# Patient Record
Sex: Female | Born: 1935 | Race: White | Hispanic: No | Marital: Married | Smoking: Never smoker
Health system: Southern US, Community
[De-identification: ages and names within clinical notes are randomized; demographics above are authoritative.]

## PROBLEM LIST (undated history)

## (undated) DIAGNOSIS — F0390 Unspecified dementia without behavioral disturbance: Secondary | ICD-10-CM

## (undated) DIAGNOSIS — G479 Sleep disorder, unspecified: Secondary | ICD-10-CM

## (undated) DIAGNOSIS — F0280 Dementia in other diseases classified elsewhere without behavioral disturbance: Secondary | ICD-10-CM

## (undated) DIAGNOSIS — I10 Essential (primary) hypertension: Secondary | ICD-10-CM

## (undated) DIAGNOSIS — K219 Gastro-esophageal reflux disease without esophagitis: Secondary | ICD-10-CM

## (undated) DIAGNOSIS — G309 Alzheimer's disease, unspecified: Secondary | ICD-10-CM

## (undated) HISTORY — DX: Essential (primary) hypertension: I10

---

## 1997-10-23 ENCOUNTER — Ambulatory Visit (HOSPITAL_COMMUNITY): Admission: RE | Admit: 1997-10-23 | Discharge: 1997-10-23 | Payer: Self-pay

## 1998-04-24 ENCOUNTER — Ambulatory Visit (HOSPITAL_COMMUNITY): Admission: RE | Admit: 1998-04-24 | Discharge: 1998-04-24 | Payer: Self-pay | Admitting: Obstetrics and Gynecology

## 1998-04-30 ENCOUNTER — Other Ambulatory Visit: Admission: RE | Admit: 1998-04-30 | Discharge: 1998-04-30 | Payer: Self-pay | Admitting: Obstetrics and Gynecology

## 1999-05-08 ENCOUNTER — Other Ambulatory Visit: Admission: RE | Admit: 1999-05-08 | Discharge: 1999-05-08 | Payer: Self-pay | Admitting: Obstetrics and Gynecology

## 1999-05-16 ENCOUNTER — Encounter: Payer: Self-pay | Admitting: Obstetrics and Gynecology

## 1999-05-16 ENCOUNTER — Ambulatory Visit (HOSPITAL_COMMUNITY): Admission: RE | Admit: 1999-05-16 | Discharge: 1999-05-16 | Payer: Self-pay | Admitting: Obstetrics and Gynecology

## 1999-08-29 ENCOUNTER — Emergency Department (HOSPITAL_COMMUNITY): Admission: EM | Admit: 1999-08-29 | Discharge: 1999-08-29 | Payer: Self-pay | Admitting: Emergency Medicine

## 2000-05-25 ENCOUNTER — Ambulatory Visit (HOSPITAL_COMMUNITY): Admission: RE | Admit: 2000-05-25 | Discharge: 2000-05-25 | Payer: Self-pay | Admitting: Obstetrics and Gynecology

## 2000-05-25 ENCOUNTER — Encounter: Payer: Self-pay | Admitting: Obstetrics and Gynecology

## 2000-06-08 ENCOUNTER — Other Ambulatory Visit: Admission: RE | Admit: 2000-06-08 | Discharge: 2000-06-08 | Payer: Self-pay | Admitting: Obstetrics and Gynecology

## 2000-11-03 ENCOUNTER — Encounter: Payer: Self-pay | Admitting: Neurology

## 2000-11-03 ENCOUNTER — Encounter: Admission: RE | Admit: 2000-11-03 | Discharge: 2000-11-03 | Payer: Self-pay | Admitting: Neurology

## 2001-02-26 ENCOUNTER — Encounter: Payer: Self-pay | Admitting: Neurology

## 2001-02-26 ENCOUNTER — Encounter: Admission: RE | Admit: 2001-02-26 | Discharge: 2001-02-26 | Payer: Self-pay | Admitting: Neurology

## 2001-06-30 ENCOUNTER — Ambulatory Visit (HOSPITAL_COMMUNITY): Admission: RE | Admit: 2001-06-30 | Discharge: 2001-06-30 | Payer: Self-pay | Admitting: Obstetrics and Gynecology

## 2001-06-30 ENCOUNTER — Encounter: Payer: Self-pay | Admitting: Obstetrics and Gynecology

## 2001-07-05 ENCOUNTER — Other Ambulatory Visit: Admission: RE | Admit: 2001-07-05 | Discharge: 2001-07-05 | Payer: Self-pay | Admitting: Obstetrics and Gynecology

## 2002-07-01 ENCOUNTER — Ambulatory Visit (HOSPITAL_COMMUNITY): Admission: RE | Admit: 2002-07-01 | Discharge: 2002-07-01 | Payer: Self-pay | Admitting: Obstetrics and Gynecology

## 2002-07-01 ENCOUNTER — Encounter: Payer: Self-pay | Admitting: Obstetrics and Gynecology

## 2002-07-12 ENCOUNTER — Encounter: Payer: Self-pay | Admitting: Obstetrics and Gynecology

## 2002-07-12 ENCOUNTER — Encounter: Admission: RE | Admit: 2002-07-12 | Discharge: 2002-07-12 | Payer: Self-pay | Admitting: Obstetrics and Gynecology

## 2002-08-08 ENCOUNTER — Other Ambulatory Visit: Admission: RE | Admit: 2002-08-08 | Discharge: 2002-08-08 | Payer: Self-pay | Admitting: Obstetrics and Gynecology

## 2003-02-02 ENCOUNTER — Encounter: Payer: Self-pay | Admitting: Orthopedic Surgery

## 2003-02-02 ENCOUNTER — Encounter: Admission: RE | Admit: 2003-02-02 | Discharge: 2003-02-02 | Payer: Self-pay | Admitting: Orthopedic Surgery

## 2003-02-09 ENCOUNTER — Encounter: Payer: Self-pay | Admitting: Internal Medicine

## 2003-08-14 ENCOUNTER — Encounter: Admission: RE | Admit: 2003-08-14 | Discharge: 2003-08-14 | Payer: Self-pay | Admitting: Family Medicine

## 2004-04-23 ENCOUNTER — Other Ambulatory Visit: Admission: RE | Admit: 2004-04-23 | Discharge: 2004-04-23 | Payer: Self-pay | Admitting: Obstetrics and Gynecology

## 2004-05-28 ENCOUNTER — Encounter: Admission: RE | Admit: 2004-05-28 | Discharge: 2004-05-28 | Payer: Self-pay | Admitting: Obstetrics and Gynecology

## 2004-08-27 ENCOUNTER — Encounter: Admission: RE | Admit: 2004-08-27 | Discharge: 2004-08-27 | Payer: Self-pay | Admitting: Obstetrics and Gynecology

## 2004-09-04 ENCOUNTER — Encounter: Admission: RE | Admit: 2004-09-04 | Discharge: 2004-09-04 | Payer: Self-pay | Admitting: Obstetrics and Gynecology

## 2004-11-25 ENCOUNTER — Ambulatory Visit: Payer: Self-pay | Admitting: Internal Medicine

## 2005-05-13 ENCOUNTER — Other Ambulatory Visit: Admission: RE | Admit: 2005-05-13 | Discharge: 2005-05-13 | Payer: Self-pay | Admitting: Obstetrics and Gynecology

## 2005-05-16 ENCOUNTER — Emergency Department (HOSPITAL_COMMUNITY): Admission: EM | Admit: 2005-05-16 | Discharge: 2005-05-17 | Payer: Self-pay | Admitting: Emergency Medicine

## 2005-09-05 ENCOUNTER — Encounter: Admission: RE | Admit: 2005-09-05 | Discharge: 2005-09-05 | Payer: Self-pay | Admitting: Obstetrics and Gynecology

## 2005-11-25 ENCOUNTER — Ambulatory Visit: Payer: Self-pay | Admitting: Internal Medicine

## 2006-06-18 ENCOUNTER — Encounter: Admission: RE | Admit: 2006-06-18 | Discharge: 2006-06-18 | Payer: Self-pay | Admitting: Obstetrics and Gynecology

## 2006-06-30 ENCOUNTER — Ambulatory Visit: Payer: Self-pay | Admitting: Internal Medicine

## 2006-07-01 ENCOUNTER — Ambulatory Visit: Payer: Self-pay | Admitting: Internal Medicine

## 2006-07-01 ENCOUNTER — Encounter: Payer: Self-pay | Admitting: Internal Medicine

## 2007-03-11 ENCOUNTER — Ambulatory Visit: Payer: Self-pay | Admitting: Internal Medicine

## 2007-04-28 ENCOUNTER — Encounter: Admission: RE | Admit: 2007-04-28 | Discharge: 2007-04-28 | Payer: Self-pay | Admitting: Orthopedic Surgery

## 2007-04-29 ENCOUNTER — Ambulatory Visit (HOSPITAL_BASED_OUTPATIENT_CLINIC_OR_DEPARTMENT_OTHER): Admission: RE | Admit: 2007-04-29 | Discharge: 2007-04-30 | Payer: Self-pay | Admitting: Orthopedic Surgery

## 2007-07-02 ENCOUNTER — Encounter: Admission: RE | Admit: 2007-07-02 | Discharge: 2007-07-02 | Payer: Self-pay | Admitting: Obstetrics and Gynecology

## 2008-05-03 ENCOUNTER — Encounter: Admission: RE | Admit: 2008-05-03 | Discharge: 2008-05-03 | Payer: Self-pay | Admitting: Obstetrics and Gynecology

## 2008-06-29 ENCOUNTER — Encounter: Admission: RE | Admit: 2008-06-29 | Discharge: 2008-06-29 | Payer: Self-pay | Admitting: Neurosurgery

## 2008-07-03 ENCOUNTER — Encounter: Admission: RE | Admit: 2008-07-03 | Discharge: 2008-07-03 | Payer: Self-pay | Admitting: Obstetrics and Gynecology

## 2008-09-08 ENCOUNTER — Inpatient Hospital Stay (HOSPITAL_COMMUNITY): Admission: RE | Admit: 2008-09-08 | Discharge: 2008-09-10 | Payer: Self-pay | Admitting: Neurosurgery

## 2008-11-12 ENCOUNTER — Emergency Department (HOSPITAL_BASED_OUTPATIENT_CLINIC_OR_DEPARTMENT_OTHER): Admission: EM | Admit: 2008-11-12 | Discharge: 2008-11-12 | Payer: Self-pay | Admitting: Emergency Medicine

## 2009-02-23 HISTORY — PX: NM MYOCAR PERF WALL MOTION: HXRAD629

## 2009-05-18 ENCOUNTER — Telehealth: Payer: Self-pay | Admitting: Internal Medicine

## 2009-05-18 DIAGNOSIS — K5730 Diverticulosis of large intestine without perforation or abscess without bleeding: Secondary | ICD-10-CM | POA: Insufficient documentation

## 2009-05-18 DIAGNOSIS — Z872 Personal history of diseases of the skin and subcutaneous tissue: Secondary | ICD-10-CM | POA: Insufficient documentation

## 2009-05-18 DIAGNOSIS — J984 Other disorders of lung: Secondary | ICD-10-CM

## 2009-05-18 DIAGNOSIS — K2970 Gastritis, unspecified, without bleeding: Secondary | ICD-10-CM | POA: Insufficient documentation

## 2009-05-18 DIAGNOSIS — R109 Unspecified abdominal pain: Secondary | ICD-10-CM | POA: Insufficient documentation

## 2009-05-18 DIAGNOSIS — Z860100 Personal history of colon polyps, unspecified: Secondary | ICD-10-CM | POA: Insufficient documentation

## 2009-05-18 DIAGNOSIS — Z8719 Personal history of other diseases of the digestive system: Secondary | ICD-10-CM

## 2009-05-18 DIAGNOSIS — E785 Hyperlipidemia, unspecified: Secondary | ICD-10-CM

## 2009-05-18 DIAGNOSIS — M129 Arthropathy, unspecified: Secondary | ICD-10-CM | POA: Insufficient documentation

## 2009-05-18 DIAGNOSIS — K2990 Gastroduodenitis, unspecified, without bleeding: Secondary | ICD-10-CM

## 2009-05-18 DIAGNOSIS — F329 Major depressive disorder, single episode, unspecified: Secondary | ICD-10-CM | POA: Insufficient documentation

## 2009-05-18 DIAGNOSIS — F3289 Other specified depressive episodes: Secondary | ICD-10-CM | POA: Insufficient documentation

## 2009-05-18 DIAGNOSIS — K5520 Angiodysplasia of colon without hemorrhage: Secondary | ICD-10-CM | POA: Insufficient documentation

## 2009-05-21 ENCOUNTER — Ambulatory Visit: Payer: Self-pay | Admitting: Internal Medicine

## 2009-05-21 LAB — CONVERTED CEMR LAB
ALT: 20 units/L (ref 0–35)
AST: 17 units/L (ref 0–37)
Albumin: 3.9 g/dL (ref 3.5–5.2)
Alkaline Phosphatase: 58 units/L (ref 39–117)
BUN: 17 mg/dL (ref 6–23)
Basophils Absolute: 0 10*3/uL (ref 0.0–0.1)
Basophils Relative: 0.5 % (ref 0.0–3.0)
CO2: 26 meq/L (ref 19–32)
Calcium: 9.3 mg/dL (ref 8.4–10.5)
Chloride: 104 meq/L (ref 96–112)
Creatinine, Ser: 0.7 mg/dL (ref 0.4–1.2)
Eosinophils Absolute: 0.1 10*3/uL (ref 0.0–0.7)
Eosinophils Relative: 1.9 % (ref 0.0–5.0)
GFR calc non Af Amer: 85.77 mL/min (ref >60)
Glucose, Bld: 97 mg/dL (ref 70–99)
HCT: 34.5 % — ABNORMAL LOW (ref 36.0–46.0)
Hemoglobin: 11.5 g/dL — ABNORMAL LOW (ref 12.0–15.0)
Lymphocytes Relative: 25.8 % (ref 12.0–46.0)
Lymphs Abs: 1.7 10*3/uL (ref 0.7–4.0)
MCHC: 33.5 g/dL (ref 30.0–36.0)
MCV: 88.3 fL (ref 78.0–100.0)
Monocytes Absolute: 0.6 10*3/uL (ref 0.1–1.0)
Monocytes Relative: 9 % (ref 3.0–12.0)
Neutro Abs: 4.2 10*3/uL (ref 1.4–7.7)
Neutrophils Relative %: 62.8 % (ref 43.0–77.0)
Platelets: 217 10*3/uL (ref 150.0–400.0)
Potassium: 4.2 meq/L (ref 3.5–5.1)
RBC: 3.9 M/uL (ref 3.87–5.11)
RDW: 12.7 % (ref 11.5–14.6)
Sodium: 143 meq/L (ref 135–145)
Total Bilirubin: 0.5 mg/dL (ref 0.3–1.2)
Total Protein: 6.4 g/dL (ref 6.0–8.3)
WBC: 6.6 10*3/uL (ref 4.5–10.5)

## 2009-06-12 ENCOUNTER — Encounter: Payer: Self-pay | Admitting: Emergency Medicine

## 2009-06-12 ENCOUNTER — Inpatient Hospital Stay (HOSPITAL_COMMUNITY): Admission: RE | Admit: 2009-06-12 | Discharge: 2009-06-14 | Payer: Self-pay | Admitting: Internal Medicine

## 2009-06-12 ENCOUNTER — Ambulatory Visit: Payer: Self-pay | Admitting: Diagnostic Radiology

## 2009-06-13 ENCOUNTER — Encounter (INDEPENDENT_AMBULATORY_CARE_PROVIDER_SITE_OTHER): Payer: Self-pay | Admitting: Internal Medicine

## 2009-06-13 ENCOUNTER — Ambulatory Visit: Payer: Self-pay | Admitting: Vascular Surgery

## 2009-06-14 ENCOUNTER — Encounter (INDEPENDENT_AMBULATORY_CARE_PROVIDER_SITE_OTHER): Payer: Self-pay | Admitting: Internal Medicine

## 2009-07-06 ENCOUNTER — Encounter: Admission: RE | Admit: 2009-07-06 | Discharge: 2009-07-06 | Payer: Self-pay | Admitting: Obstetrics and Gynecology

## 2009-12-05 ENCOUNTER — Ambulatory Visit: Payer: Self-pay | Admitting: Internal Medicine

## 2009-12-05 LAB — CONVERTED CEMR LAB
ALT: 19 units/L (ref 0–35)
AST: 16 units/L (ref 0–37)
Albumin: 3.8 g/dL (ref 3.5–5.2)
Alkaline Phosphatase: 67 units/L (ref 39–117)
BUN: 13 mg/dL (ref 6–23)
Basophils Absolute: 0 10*3/uL (ref 0.0–0.1)
Basophils Relative: 0.5 % (ref 0.0–3.0)
CO2: 30 meq/L (ref 19–32)
Calcium: 9.1 mg/dL (ref 8.4–10.5)
Chloride: 104 meq/L (ref 96–112)
Creatinine, Ser: 0.7 mg/dL (ref 0.4–1.2)
Eosinophils Absolute: 0.1 10*3/uL (ref 0.0–0.7)
Eosinophils Relative: 1.8 % (ref 0.0–5.0)
GFR calc non Af Amer: 85.65 mL/min (ref >60)
Glucose, Bld: 107 mg/dL — ABNORMAL HIGH (ref 70–99)
HCT: 35 % — ABNORMAL LOW (ref 36.0–46.0)
Hemoglobin: 12.1 g/dL (ref 12.0–15.0)
Lymphocytes Relative: 23.3 % (ref 12.0–46.0)
Lymphs Abs: 1.4 10*3/uL (ref 0.7–4.0)
MCHC: 34.6 g/dL (ref 30.0–36.0)
MCV: 88.4 fL (ref 78.0–100.0)
Monocytes Absolute: 0.5 10*3/uL (ref 0.1–1.0)
Monocytes Relative: 8.1 % (ref 3.0–12.0)
Neutro Abs: 3.9 10*3/uL (ref 1.4–7.7)
Neutrophils Relative %: 66.3 % (ref 43.0–77.0)
Platelets: 239 10*3/uL (ref 150.0–400.0)
Potassium: 4.4 meq/L (ref 3.5–5.1)
RBC: 3.96 M/uL (ref 3.87–5.11)
RDW: 13.8 % (ref 11.5–14.6)
Sodium: 141 meq/L (ref 135–145)
Total Bilirubin: 0.7 mg/dL (ref 0.3–1.2)
Total Protein: 6.5 g/dL (ref 6.0–8.3)
WBC: 5.9 10*3/uL (ref 4.5–10.5)

## 2009-12-06 ENCOUNTER — Ambulatory Visit: Payer: Self-pay | Admitting: Internal Medicine

## 2009-12-17 ENCOUNTER — Ambulatory Visit (HOSPITAL_COMMUNITY): Admission: RE | Admit: 2009-12-17 | Discharge: 2009-12-17 | Payer: Self-pay | Admitting: Internal Medicine

## 2010-07-16 ENCOUNTER — Encounter: Admission: RE | Admit: 2010-07-16 | Discharge: 2010-07-16 | Payer: Self-pay | Admitting: Obstetrics and Gynecology

## 2010-07-24 ENCOUNTER — Encounter
Admission: RE | Admit: 2010-07-24 | Discharge: 2010-07-24 | Payer: Self-pay | Source: Home / Self Care | Attending: Obstetrics and Gynecology | Admitting: Obstetrics and Gynecology

## 2010-09-12 NOTE — Procedures (Signed)
Summary: Colonoscopy  Patient: Fraidy Mccarrick Note: All result statuses are Final unless otherwise noted.  Tests: (1) Colonoscopy (COL)   COL Colonoscopy           DONE     [facility]     [address]     [place], [place]  [address]           COLONOSCOPY PROCEDURE REPORT           PATIENT:  Leanah, Kolander  MR#:  [id number]     BIRTHDATE:  [birth year], 81 yrs. old  GENDER:  female     ENDOSCOPIST:  Hedwig Morton. Juanda Chance, MD     REF. BY:  Miguel Aschoff, M.D.     PROCEDURE DATE:  12/06/2009     PROCEDURE:  Colonoscopy 19147     ASA CLASS:  Class I     INDICATIONS:  Elevated Risk Screening prior colon 1999,anal     fistula 1994     colon polyp 2004     ? avm right colon 2004     MEDICATIONS:   Versed 10 mg, Fentanyl 75 mcg           DESCRIPTION OF PROCEDURE:   After the risks benefits and     alternatives of the procedure were thoroughly explained, informed     consent was obtained.  Digital rectal exam was performed and     revealed no rectal masses.   The LB CF-[id number] endoscope     was introduced through the anus and advanced to the cecum, which     was identified by both the appendix and ileocecal valve, without     limitations.  The quality of the prep was good, using MiraLax.     The instrument was then slowly withdrawn as the colon was fully     examined.     <<PROCEDUREIMAGES>>           FINDINGS:  No polyps or cancers were seen (see image1, image2,     image3, and image4).   Retroflexed views in the rectum revealed no     abnormalities.    The scope was then withdrawn from the patient     and the procedure completed.           COMPLICATIONS:  None     ENDOSCOPIC IMPRESSION:     1) No polyps or cancers     RECOMMENDATIONS:     1) high fiber diet     REPEAT EXAM:  In 10 year(s) for.           ______________________________     Hedwig Morton. Juanda Chance, MD           CC:           n.     eSIGNED:   Hedwig Morton. Teruo Stilley at 12/06/2009 10:28 AM           Aaron Mose,  [phone]  Note: An exclamation mark (!) indicates a result that was not dispersed into the flowsheet. Document Creation Date: 12/06/2009 10:29 AM _______________________________________________________________________  (1) Order result status: Final Collection or observation date-time: 12/06/2009 10:17 Requested date-time:  Receipt date-time:  Reported date-time:  Referring Physician:   Ordering Physician: Lina Sar [phone]) Specimen Source:  Source: Launa Grill Order Number: [id number] Lab site:   Appended Document: Colonoscopy    Clinical Lists Changes  Observations: Added new observation of COLONNXTDUE: 11/2019 (12/06/2009 14:51)

## 2010-09-12 NOTE — Letter (Signed)
Summary: [facility] Instructions  [facility]  [address] [place], [place] [address]   Phone: [phone]  Fax: [phone]       [name] [name]    1928-07-05    MRN: [id number]       Procedure Day /Date: 12/06/09 Thursday     Arrival Time: 8:30 am     Procedure Time: 9:30 am     Location of Procedure:                    _x_  [facility] (4th Floor)  PREPARATION FOR COLONOSCOPY WITH MIRALAX  Starting 5 days prior to your procedure  do not eat nuts, seeds, popcorn, corn, beans, peas,  salads, or any raw vegetables.  Do not take any fiber supplements (e.g. Metamucil, Citrucel, and Benefiber). ____________________________________________________________________________________________________   THE DAY BEFORE YOUR PROCEDURE         DATE: 12/05/09 DAY: Wednesday  1   Drink clear liquids the entire day-NO SOLID FOOD  2   Do not drink anything colored red or purple.  Avoid juices with pulp.  No orange juice.  3   Drink at least 64 oz. (8 glasses) of fluid/clear liquids during the day to prevent dehydration and help the prep work efficiently.  CLEAR LIQUIDS INCLUDE: Water Jello Ice Popsicles Tea (sugar ok, no milk/cream) Powdered fruit flavored drinks Coffee (sugar ok, no milk/cream) Gatorade Juice: apple, white grape, white cranberry  Lemonade Clear bullion, consomm, broth Carbonated beverages (any kind) Strained chicken noodle soup Hard Candy  4   Mix the entire bottle of Miralax with 64 oz. of Gatorade/Powerade in the morning and put in the refrigerator to chill.  5   At 3:00 pm take 2 Dulcolax/Bisacodyl tablets.  6   At 4:30 pm take one Reglan/Metoclopramide tablet.  7  Starting at 5:00 pm drink one 8 oz glass of the Miralax mixture every 15-20 minutes until you have finished drinking the entire 64 oz.  You should finish drinking prep around 7:30 or 8:00 pm.  8   If you are nauseated, you may take the 2nd Reglan/Metoclopramide tablet at 6:30  pm.        9    At 8:00 pm take 2 more DULCOLAX/Bisacodyl tablets.        THE DAY OF YOUR PROCEDURE      DATE:  12/06/09 DAY: Thursday  You may drink clear liquids until 7:30 am  (2 HOURS BEFORE PROCEDURE).   MEDICATION INSTRUCTIONS  Unless otherwise instructed, you should take regular prescription medications with a small sip of water as early as possible the morning of your procedure.       OTHER INSTRUCTIONS  You will need a responsible adult at least 75 years of age to accompany you and drive you home.   This person must remain in the waiting room during your procedure.  Wear loose fitting clothing that is easily removed.  Leave jewelry and other valuables at home.  However, you may wish to bring a book to read or an iPod/MP3 player to listen to music as you wait for your procedure to start.  Remove all body piercing jewelry and leave at home.  Total time from sign-in until discharge is approximately 2-3 hours.  You should go home directly after your procedure and rest.  You can resume normal activities the day after your procedure.  The day of your procedure you should not:   Drive   Make legal decisions  Operate machinery   Drink alcohol   Return to work  You will receive specific instructions about eating, activities and medications before you leave.   The above instructions have been reviewed and explained to me by   Lamona Curl CMA Duncan Dull)  December 05, 2009 9:07 AM     I fully understand and can verbalize these instructions _____________________________ Date 12/05/09

## 2010-09-12 NOTE — Assessment & Plan Note (Signed)
Summary: ABD PAIN...AS.   History of Present Illness Visit Type: Follow-up Visit Primary GI MD: Lina Sar MD Primary Provider: Duane Lope, MD  Requesting Provider: n/a Chief Complaint: Generilized abdominal pain also c/o N/V with pain History of Present Illness:   This is a 77 year old white female whom we have seen in the past for gastritis as well as for small bowel obstruction.  A colonoscopy done in July 2004 showed a polyp. The pathology confirmed the polyp to be consistant with polypoid mucosa. She also had AVMs. Her abdominal ultrasound in 2008 showed her common bile duct to be 7.8 mm with a normal gallbladder wall of 1.6 mm. She had gastritis on an upper endoscopy in November 2007 which was H. pylori positive. She does not remember being treated. She comes today complaining of generalized abdominal pain with nausea and vomiting. She denies any reflux symptoms or change in bowels.   GI Review of Systems    Reports abdominal pain, nausea, and  vomiting.     Location of  Abdominal pain: generalized.    Denies acid reflux, belching, bloating, chest pain, dysphagia with liquids, dysphagia with solids, heartburn, loss of appetite, vomiting blood, weight loss, and  weight gain.        Denies anal fissure, black tarry stools, change in bowel habit, constipation, diarrhea, diverticulosis, fecal incontinence, heme positive stool, hemorrhoids, irritable bowel syndrome, jaundice, light color stool, liver problems, rectal bleeding, and  rectal pain.    Current Medications (verified): 1)  Bayer Low Strength 81 Mg Tbec (Aspirin) .... One Tablet By Mouth Once Daily 2)  Iron 325 (65 Fe) Mg Tabs (Ferrous Sulfate) .... One Tablet By Mouth Once Daily 3)  Calcium 600 1500 Mg Tabs (Calcium Carbonate) .... One Tablet By Mouth Once Daily 4)  Coq10 100 Mg Caps (Coenzyme Q10) .... One Tablet By Mouth Once Daily 5)  Fish Oil 1000 Mg Caps (Omega-3 Fatty Acids) .... One Tablet By Mouth Once Daily 6)  Diovan  Hct 160-12.5 Mg Tabs (Valsartan-Hydrochlorothiazide) .Marland Kitchen.. 1 By Mouth Once Daily  Allergies: 1)  ! Pcn  Past History:  Past Medical History: Reviewed history from 05/18/2009 and no changes required. 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 Anal Fissure Repair Back Surgery   Family History: Family History of Pancreatic Cancer: Father Family History of Heart Disease: Mother Family History of Diabetes: Mother, Father  Social History: Reviewed history from 05/21/2009 and no changes required. Alcohol Use - yes-occasional Illicit Drug Use - no Occupation: Retired Married Daily Caffeine Use: 2 cups of coffee daily and three glasses of tea daily   Review of Systems       The patient complains of sleeping problems.  The patient denies allergy/sinus, anemia, anxiety-new, arthritis/joint pain, back pain, blood in urine, breast changes/lumps, change in vision, confusion, cough, coughing up blood, depression-new, fainting, fatigue, fever, headaches-new, hearing problems, heart murmur, heart rhythm changes, itching, menstrual pain, muscle pains/cramps, night sweats, nosebleeds, pregnancy symptoms, shortness of breath, skin rash, sore throat, swelling of feet/legs, swollen lymph glands, thirst - excessive , urination - excessive , urination changes/pain, urine leakage, vision changes, and voice change.  Pertinent positive and negative review of systems were noted in the above HPI. All other ROS was otherwise negative.   Vital Signs:  Patient  profile:   77 year old female Height:      63 inches Weight:      146 pounds BMI:     25.96 BSA:     1.69 Pulse rate:   90 / minute Pulse rhythm:   regular BP sitting:   122 / 82  (left arm)  Vitals Entered By: Merri Ray CMA Duncan Dull) (December 05, 2009 8:17 AM)  Physical Exam  General:  Well developed, well nourished, no acute distress. Eyes:  PERRLA, no icterus. Mouth:  No deformity or lesions, dentition normal. Neck:  Supple; no masses or thyromegaly. Lungs:  Clear throughout to auscultation. Heart:  Regular rate and rhythm; no murmurs, rubs,  or bruits. Abdomen:  soft mildly protuberant abdomen with normal active bowel sounds. Mild tenderness in right lower quadrant across post appendectomy scar. There is no palpable mass or rebound. Liver edge is at  costal margin. Extremities:  No clubbing, cyanosis, edema or deformities noted. Skin:  Intact without significant lesions or rashes. Psych:  Alert and cooperative. Normal mood and affect.   Impression & Recommendations:  Problem # 1:  ABDOMINAL PAIN, UNSPECIFIED SITE (ICD-789.00) Patient has lower abdominal pain with tenderness in the right lower quadrant. Her last colonoscopy was in 2004. She is due for a recall colonoscopy because of her history of colon polyps. She had an abnormal abdominal ultrasound 3 years ago with a dilated common bile duct at 7.8 mm. We will obtain a HIDA scan to rule out biliary dysfunction and we will also obtain liver function tests.  Orders: Colonoscopy (Colon) HIDA CCK (HIDA CCK) TLB-CBC Platelet - w/Differential (85025-CBCD) TLB-CMP (Comprehensive Metabolic Pnl) (80053-COMP)  Problem # 2:  COLONIC POLYPS, HX OF (ICD-V12.72) Patient is due for her recall colonoscopy. We will schedule that today.  Orders: Colonoscopy (Colon)  Problem # 3:  HELICOBACTER PYLORI GASTRITIS, HX OF (ICD-V12.79) Patient is status post treatment for H. pylori.  Patient Instructions: 1)  HIDA scan with CCK-scheduled  for 12/17/09. 2)  Please go to the basement to have her liver function tests and CBC drawn today. 3)  A colonoscopy has been scheduled for 12/06/09. 4)  Samples of AcipHex 20 mg daily. 5)  Copy sent to : Dr A.Ross 6)  The medication list was reviewed and reconciled.  All changed / newly prescribed medications were explained.  A complete medication list was provided to the patient / caregiver. Prescriptions: DULCOLAX 5 MG  TBEC (BISACODYL) Day before procedure take 2 at 3pm and 2 at 8pm.  #4 x 0   Entered by:   Lamona Curl CMA (AAMA)   Authorized by:   Hart Carwin MD   Signed by:   Lamona Curl CMA (AAMA) on 12/05/2009   Method used:   Electronically to        [facility] (retail)       [address]       [place], [place]  [address]       Ph: [phone] or [phone]       Fax: [phone]   RxID:   [id number] REGLAN 10 MG  TABS (METOCLOPRAMIDE HCL) As per prep instructions.  #2 x 0   Entered by:   Lamona Curl CMA (AAMA)   Authorized by:   Verlee Monte  Arne Cleveland MD   Signed by:   Lamona Curl CMA (AAMA) on 12/05/2009   Method used:   Electronically to        [facility] (retail)       [address]       [place], [place]  [address]       Ph: [phone] or [phone]       Fax: [phone]   RxID:   [id number] MIRALAX   POWD (POLYETHYLENE GLYCOL 3350) As per prep  instructions.  #255gm x 0   Entered by:   Lamona Curl CMA (AAMA)   Authorized by:   Hart Carwin MD   Signed by:   Lamona Curl CMA (AAMA) on 12/05/2009   Method used:   Electronically to        [facility] (retail)       [address]       [place], [place]  [address]       Ph: [phone] or [phone]       Fax: [phone]   RxID:   [id number]

## 2010-11-13 LAB — BASIC METABOLIC PANEL
BUN: 10 mg/dL (ref 6–23)
Chloride: 106 mEq/L (ref 96–112)
Creatinine, Ser: 0.67 mg/dL (ref 0.4–1.2)
GFR calc non Af Amer: >60 mL/min (ref >60)
Glucose, Bld: 104 mg/dL — ABNORMAL HIGH (ref 70–99)
Potassium: 3.2 mEq/L — ABNORMAL LOW (ref 3.5–5.1)

## 2010-11-13 LAB — CBC
HCT: 37 % (ref 36.0–46.0)
Hemoglobin: 11 g/dL — ABNORMAL LOW (ref 12.0–15.0)
Hemoglobin: 12.9 g/dL (ref 12.0–15.0)
MCHC: 35 g/dL (ref 30.0–36.0)
MCV: 87.2 fL (ref 78.0–100.0)
RBC: 3.61 MIL/uL — ABNORMAL LOW (ref 3.87–5.11)
RBC: 4.24 MIL/uL (ref 3.87–5.11)

## 2010-11-13 LAB — DIFFERENTIAL
Eosinophils Absolute: 0 10*3/uL (ref 0.0–0.7)
Eosinophils Relative: 0 % (ref 0–5)
Lymphocytes Relative: 14 % (ref 12–46)
Lymphs Abs: 1.1 10*3/uL (ref 0.7–4.0)
Monocytes Relative: 5 % (ref 3–12)
Neutrophils Relative %: 81 % — ABNORMAL HIGH (ref 43–77)

## 2010-11-13 LAB — POCT CARDIAC MARKERS
Myoglobin, poc: 41.7 ng/mL (ref 12–200)
Troponin i, poc: <0.05 ng/mL (ref 0.00–0.09)

## 2010-11-13 LAB — COMPREHENSIVE METABOLIC PANEL
ALT: 25 U/L (ref 0–35)
CO2: 26 mEq/L (ref 19–32)
Calcium: 9.3 mg/dL (ref 8.4–10.5)
Creatinine, Ser: 0.5 mg/dL (ref 0.4–1.2)
GFR calc non Af Amer: >60 mL/min (ref >60)
Glucose, Bld: 155 mg/dL — ABNORMAL HIGH (ref 70–99)

## 2010-11-13 LAB — LIPID PANEL
Cholesterol: 195 mg/dL (ref 0–200)
LDL Cholesterol: 136 mg/dL — ABNORMAL HIGH (ref 0–99)
Triglycerides: 154 mg/dL — ABNORMAL HIGH (ref <150)
VLDL: 31 mg/dL (ref 0–40)

## 2010-11-25 LAB — TYPE AND SCREEN
ABO/RH(D): A POS
Antibody Screen: NEGATIVE

## 2010-11-25 LAB — BASIC METABOLIC PANEL
CO2: 27 mEq/L (ref 19–32)
Calcium: 9.6 mg/dL (ref 8.4–10.5)
Creatinine, Ser: 0.57 mg/dL (ref 0.4–1.2)
GFR calc Af Amer: >60 mL/min (ref >60)
Sodium: 140 mEq/L (ref 135–145)

## 2010-11-25 LAB — CBC
Hemoglobin: 12.8 g/dL (ref 12.0–15.0)
RBC: 4.21 MIL/uL (ref 3.87–5.11)
WBC: 7.9 10*3/uL (ref 4.0–10.5)

## 2010-11-25 LAB — ABO/RH: ABO/RH(D): A POS

## 2010-12-24 NOTE — Op Note (Signed)
NAMEBLAKELEIGH, DOMEK                ACCOUNT NO.:  [id number]   MEDICAL RECORD NO.:  [id number]          PATIENT TYPE:  AMB   LOCATION:  DSC                          FACILITY:  [facility]   PHYSICIAN:  Loreta Ave, M.D. DATE OF BIRTH:  Dec 29, 1927   DATE OF PROCEDURE:  04/29/2007  DATE OF DISCHARGE:                               OPERATIVE REPORT   PREOPERATIVE DIAGNOSIS:  Right shoulder impingement, distal clavicle  osteolysis, rotator cuff tear.   POSTOPERATIVE DIAGNOSES:  1. Right shoulder impingement, distal clavicle osteolysis, rotator      cuff tear.  2. 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   PROCEDURE:  1. Right shoulder exam under anesthesia.  2. Arthroscopy with debridement of rotator cuff.  3. Acromioplasty, coraco-acromial ligament release, bursectomy.  4. Excision of distal clavicle.  5. Attempted arthroscopic followed by mini open repair of rotator cuff      tear with FiberWire weave suture x2 and Concept repair system.   SURGEON:  Loreta Ave, M.D.   ASSISTANT:  Zonia Kief, P.A.   ANESTHESIA:  General.   BLOOD LOSS:  Minimal.   SPECIMENS:  None.   CULTURES:  None   COMPLICATIONS:  None.   PROCEDURE:  Soft compressive with immobilizer.   PROCEDURE:  The patient was brought to the operating room and placed on  the operative table in supine position.  After adequate anesthesia had  been obtained, right shoulder was examined.  Full motion with stable  shoulder.  Placed in a beach-chair position on a shoulder positioner and  prepped and draped in the usual sterile fashion.  Three portals,  anterior, posterior and lateral.  Shoulder was entered with a blunt  obturator, arthroscope introduced and shoulder distended and inspected.  Full-thickness tear of entire supraspinatus tendon, attritional in  nature, crescent region.  Still mobile and reparable.  Very reasonable  cuff quality.  Biceps tendon, biceps anchor, articular  cartilage,  capsule and ligamentous structures intact.  Cuff debrided and assessed.  Cannula redirected subacromially.  Type II to III acromion.  Acromioplasty to a type 1 acromion, releasing the CA ligament with  cautery.  Distal clavicle with grade 4 changes and marked spurring.  Periarticular spurs and a lateral centimeter of clavicle resected.  Adequacy of decompression confirmed throughout.  I then used the  Scorpion device to insert 2 horizontal mattress Fiberweave sutures into  the cuff.  These were brought out laterally and I attempted to repair  the cuff with the swivel lock bioabsorbable screw.  Unfortunately, once  the screw was inserted into the tuberosity and I attempted this in 2  areas, it would not hold.  This was because of the degree of osteopenia  within the tuberosity itself.  The sutures left in place.  The anchors  that did hold were removed.  Deltoid-splitting incision.  Subacromial  space accessed.  Adequacy of decompression confirmed.  I then made a  series of drill holes in the tuberosity, took the Fiberweave sutures  through these.  I abducted the arm and firmly tied them over a bony  bridge,  coming way down laterally on the humerus.  I got reasonable  enough bone there to get a firm nice watertight closure of the cuff.  I  am still concerned about this holding up, but I was able to get a nice  firm repair.  Wound irrigated.  Deltoid closed with Vicryl, skin and  subcutaneous tissue with Vicryl.  Portals closed with nylon.  Sterile  compressive dressing applied.  Shoulder immobilizer applied.  Anesthesia  reversed.  Brought to recovery room.  Tolerated surgery well.  No  complications.      Loreta Ave, M.D.  Electronically Signed     DFM/MEDQ  D:  04/29/2007  T:  04/30/2007  Job:  [id number]

## 2010-12-24 NOTE — Op Note (Signed)
NAME:  [name], [name]                ACCOUNT NO.:  [id number]   MEDICAL RECORD NO.:  [id number]          PATIENT TYPE:  INP   LOCATION:  3006                         FACILITY:  [facility]   PHYSICIAN:  Reinaldo Meeker, M.D. DATE OF BIRTH:  Aug 28, 1927   DATE OF PROCEDURE:  09/08/2008  DATE OF DISCHARGE:                               OPERATIVE REPORT   PREOPERATIVE DIAGNOSIS:  Spondylolisthesis, L4-5 with left L4-5 synovial  cyst.   POSTOPERATIVE DIAGNOSIS:  Spondylolisthesis, L4-5 with left L4-5  synovial cyst.   PROCEDURES:  1. L4-5 bilateral decompression of L4 and L5 nerve roots bilaterally      more so than needed for posterior lumbar interbody fusion with      removal of synovial cyst at L4-5 left, followed by L4-5 posterior      lumbar interbody fusion with NuVasive bony spacer and PEEK      interbody cage, followed by nonsegmental instrumentation, L4-5 with      pedicle screw fixation with Biomet pedicle screw system, followed      by L4-5 nonsegmental fusion, posterolateral.  2. Microdissection, L4 and L5 nerve roots bilaterally.   SURGEON:  Reinaldo Meeker, MD   ASSISTANT:  Donalee Citrin, MD   PROCEDURE IN DETAIL:  After being placed in the prone position, the  patient's back was prepped and draped in the usual sterile fashion.  Localizing fluoroscopy was used prior to incision to identify the  appropriate level.  Midline incision was made above the spinous  processes of L4 and L5.  Using the Bovie cutting current, incision was  carried down to the spinous processes.  Subperiosteal dissection was  then carried out bilaterally on the spinous processes, lamina, facet  joint into the far lateral region to identify the transverse processes  of L4 and L5.  A fluoroscopy showed this to be appropriate level.  Spinous processes of L4 and L5 were removed bilaterally along with the  interspinous ligament.  On the patient's left side, generous laminotomy  was performed by removing the  inferior 80% of the L4 lamina, the medial  three-quarters of facet joint, and the superior one-third of the L5  lamina.  Residual bone was removed and saved for use later in the case.  Ligamentum flavum was removed in a piecemeal fashion.  Large synovial  cyst was noted and was dissected free from the spinal dura and L4 nerve  roots, which was attached.  Similar decompression was then carried on  the patient's right side.  At this time, residual midline structures  were removed to complete the bilateral decompression.  At this time,  disk space was then cleaned out thoroughly with a variety of pituitary  rongeurs and curettes.  Thorough disk space clean-out was carried out at  the same time great care was taken to avoid injury to the neural  elements, and this was successfully done.  At this time, pedicle screw  fixation was carried out.  Started the patient's left side, drill hole  entry points were made, followed by passing of an awl, tapping with a  5.5-mm tap  and placing of 6.5 x 40 mm screws at L4 and L5 bilaterally.  These were followed into good position under fluoroscopy in AP and  lateral direction.  Prior to placing the second screw bilaterally,  decortication was carried out at the transverse processes and lateral  facet joint bilaterally.  Mixture of autologous bone and Osteocel Plus  were placed for posterolateral fusion.  At this time, posterior lumbar  interbody fusion was carried out.  On the first side, distraction was  carried up to 10-mm size.  On the opposite side, rotating cutter was  used followed by placing of a PEEK interbody cage, filled with  autologous bone and Osteocel Plus.  On the opposite side, rotating  cutter was used followed by chiseling with a 10-mm chisel.  A mixture of  Osteocel Plus and autologous bone were placed in the midline to help  with interbody fusion that a NuVasive bony spacer of 10-mm size was  placed without difficulty.  Fluoroscopy showed  it to be in excellent  position.  Appropriate length rods were then chosen and secured to the  top of the screws.  Top loading nuts were then secured and torqued down.  When the L4 screws will be tightened, compression was carried out of L4  down to L5.  Final fluoroscopy in AP and lateral direction was found to  be excellent with good placement of the bony spacers, screws and rods.  A Gelfoam was placed after large amounts of irrigation were carried out.  An epidural drain was left in the epidural space and brought through a  separate stab incision.  The wounds were then closed in multiple layers  with Vicryl on the muscle fascia, subcutaneous, and subcuticular  tissues, and staples were placed on the skin.  A sterile dressing was  then applied, and the patient was extubated and taken to recovery room  in stable condition.           ______________________________  Reinaldo Meeker, M.D.     ROK/MEDQ  D:  09/08/2008  T:  09/09/2008  Job:  [id number]

## 2010-12-27 NOTE — Assessment & Plan Note (Signed)
[facility]                           GASTROENTEROLOGY OFFICE NOTE   MARGUERITE, BARBA                       MRN:          [id number]  DATE:06/30/2006                            DOB:          1928-06-10    Ms. Carlton is a very nice 77 year old white female whom we have followed in  the past for colon polyps and AVMs of the right colon.  Last colonoscopy in  2004.  Next __________ colonoscopy would be July 2009.  She is here today  because of epigastric discomfort.  She has a pain in subxyphoid area on a  daily basis which is aggravated by her bending over or coughing or sneezing.  She denies any radiation of the pain to the back.  There has been no weight  loss and no association with food.  There is no dysphagia or odynophagia.  She has never had an upper endoscopy.   MEDICATIONS:  1. Estrogen patch.  2. Paxil 10 mg daily.  3. Calcium supplements.  4. Fish oil.  5. Red rice yeast.  6. Enalapril 20 mg p.o. daily.  7. Toprol XL 50 mg p.o. daily.  8. Crestor 5 mg p.o. daily.   Significant past family history is that her father had pancreatic cancer and  what sounds like Whipple procedure and he died of metastatic cancer.  Patient is naturally concerned about that and would like to have this  evaluated.   PHYSICAL EXAMINATION:  Blood pressure 118/74. Pulse 88.  Weight 144 pounds.  She was in no distress.  LUNGS:  Are clear to auscultation.  COR:  With normal S1, normal S2. Sternum and ribcage were nontender.  ABDOMEN:  Was soft with tenderness about the umbilicus in the midline, which  did not radiate to left or right upper quadrant.  It did not radiate to the  chest either.  Low abdomen was unremarkable.  There was no pulsating mass or  bruit.  No CVA tenderness.  EXTREMITIES:  No edema.   IMPRESSION:  A 77 year old white female with persistent supraumbilical and  epigastric discomfort which is brought on by bending over and coughing.  It  is suggestive of hiatal hernia. She has a family history of gastrointestinal  malignancy and has mentioned a concern about it.  The discomfort could be  related to pancreatitis, pancreatic lesion or possibly due to peptic ulcer,  although it would be less likely.   PLAN:  1. Begin AcipHex 200 mg p.o. daily.  2. Upper endoscopy scheduled.  3. Upper abdominal ultrasound to look at the pancreas as well as liver and      common bile duct.  4. Depending on the findings she may have to be put on antireflux measures      and change her medical regimen.     Hedwig Morton. Juanda Chance, MD  Electronically Signed    DMB/MedQ  DD: 06/30/2006  DT: 06/30/2006  Job #: [id number]   cc:   C. Duane Lope, M.D.  [employer]

## 2011-01-30 ENCOUNTER — Other Ambulatory Visit: Payer: Self-pay | Admitting: Neurosurgery

## 2011-01-30 DIAGNOSIS — M545 Low back pain: Secondary | ICD-10-CM

## 2011-01-30 DIAGNOSIS — M79605 Pain in left leg: Secondary | ICD-10-CM

## 2011-02-06 ENCOUNTER — Ambulatory Visit
Admission: RE | Admit: 2011-02-06 | Discharge: 2011-02-06 | Disposition: A | Discharge status: Home / Self Care | Payer: Medicare Other | Source: Ambulatory Visit | Attending: Neurosurgery | Admitting: Neurosurgery

## 2011-02-06 DIAGNOSIS — M79605 Pain in left leg: Secondary | ICD-10-CM

## 2011-02-06 DIAGNOSIS — M545 Low back pain: Secondary | ICD-10-CM

## 2011-02-06 MED ORDER — GADOBENATE DIMEGLUMINE 529 MG/ML IV SOLN
13.0000 mL | Freq: Once | INTRAVENOUS | Status: AC | PRN
  Administered 2011-02-06: 13 mL via INTRAVENOUS

## 2011-02-27 ENCOUNTER — Other Ambulatory Visit: Payer: Self-pay | Admitting: Neurology

## 2011-02-27 DIAGNOSIS — R413 Other amnesia: Secondary | ICD-10-CM

## 2011-03-04 ENCOUNTER — Other Ambulatory Visit: Payer: Self-pay | Admitting: Neurosurgery

## 2011-03-04 DIAGNOSIS — M4800 Spinal stenosis, site unspecified: Secondary | ICD-10-CM

## 2011-03-05 ENCOUNTER — Inpatient Hospital Stay: Admission: RE | Admit: 2011-03-05 | Payer: Medicare Other | Source: Ambulatory Visit

## 2011-03-14 ENCOUNTER — Ambulatory Visit
Admission: RE | Admit: 2011-03-14 | Discharge: 2011-03-14 | Disposition: A | Discharge status: Home / Self Care | Payer: Medicare Other | Source: Ambulatory Visit | Attending: Neurology | Admitting: Neurology

## 2011-03-14 DIAGNOSIS — R413 Other amnesia: Secondary | ICD-10-CM

## 2011-03-14 MED ORDER — GADOBENATE DIMEGLUMINE 529 MG/ML IV SOLN
10.0000 mL | Freq: Once | INTRAVENOUS | Status: AC | PRN
  Administered 2011-03-14: 10 mL via INTRAVENOUS

## 2011-03-26 MED ORDER — DIAZEPAM 2 MG PO TABS
5.0000 mg | ORAL_TABLET | Freq: Once | ORAL | Status: AC
  Administered 2011-03-27: 5 mg via ORAL

## 2011-03-27 ENCOUNTER — Ambulatory Visit
Admission: RE | Admit: 2011-03-27 | Discharge: 2011-03-27 | Disposition: A | Discharge status: Home / Self Care | Payer: Medicare Other | Source: Ambulatory Visit | Attending: Neurosurgery | Admitting: Neurosurgery

## 2011-03-27 DIAGNOSIS — M4800 Spinal stenosis, site unspecified: Secondary | ICD-10-CM

## 2011-03-27 MED ORDER — IOHEXOL 180 MG/ML  SOLN
15.0000 mL | Freq: Once | INTRAMUSCULAR | Status: AC | PRN
  Administered 2011-03-27: 15 mL via INTRATHECAL

## 2011-04-29 ENCOUNTER — Encounter (HOSPITAL_COMMUNITY)
Admission: RE | Admit: 2011-04-29 | Discharge: 2011-04-29 | Disposition: A | Discharge status: Home / Self Care | Payer: Medicare Other | Source: Ambulatory Visit | Attending: Neurosurgery | Admitting: Neurosurgery

## 2011-04-29 ENCOUNTER — Other Ambulatory Visit (HOSPITAL_COMMUNITY): Payer: Self-pay | Admitting: Neurosurgery

## 2011-04-29 DIAGNOSIS — M48061 Spinal stenosis, lumbar region without neurogenic claudication: Secondary | ICD-10-CM

## 2011-04-29 LAB — SURGICAL PCR SCREEN: MRSA, PCR: NEGATIVE

## 2011-04-29 LAB — BASIC METABOLIC PANEL
BUN: 23 mg/dL (ref 6–23)
CO2: 30 mEq/L (ref 19–32)
Calcium: 9.7 mg/dL (ref 8.4–10.5)
Creatinine, Ser: 0.9 mg/dL (ref 0.50–1.10)

## 2011-04-29 LAB — CBC
HCT: 34.6 % — ABNORMAL LOW (ref 36.0–46.0)
MCH: 30.1 pg (ref 26.0–34.0)
MCV: 87.6 fL (ref 78.0–100.0)
Platelets: 247 10*3/uL (ref 150–400)
RBC: 3.95 MIL/uL (ref 3.87–5.11)
RDW: 13 % (ref 11.5–15.5)

## 2011-04-29 LAB — TYPE AND SCREEN: ABO/RH(D): A POS

## 2011-05-08 ENCOUNTER — Inpatient Hospital Stay (HOSPITAL_COMMUNITY): Payer: Medicare Other

## 2011-05-08 ENCOUNTER — Inpatient Hospital Stay (HOSPITAL_COMMUNITY)
Admission: RE | Admit: 2011-05-08 | Discharge: 2011-05-13 | DRG: 460 | Disposition: A | Discharge status: Home Health | Payer: Medicare Other | Source: Ambulatory Visit | Attending: Neurosurgery | Admitting: Neurosurgery

## 2011-05-08 DIAGNOSIS — Z01818 Encounter for other preprocedural examination: Secondary | ICD-10-CM

## 2011-05-08 DIAGNOSIS — I10 Essential (primary) hypertension: Secondary | ICD-10-CM | POA: Diagnosis present

## 2011-05-08 DIAGNOSIS — Z01810 Encounter for preprocedural cardiovascular examination: Secondary | ICD-10-CM

## 2011-05-08 DIAGNOSIS — Z01812 Encounter for preprocedural laboratory examination: Secondary | ICD-10-CM

## 2011-05-08 DIAGNOSIS — M48061 Spinal stenosis, lumbar region without neurogenic claudication: Principal | ICD-10-CM | POA: Diagnosis present

## 2011-05-08 DIAGNOSIS — Z880 Allergy status to penicillin: Secondary | ICD-10-CM

## 2011-05-12 NOTE — Op Note (Signed)
NAMECOLLEN, [name] [name]                ACCOUNT NO.:  [id number]  MEDICAL RECORD NO.:  [id number]  LOCATION:  2899                         FACILITY:  [facility]  PHYSICIAN:  Reinaldo Meeker, M.D. DATE OF BIRTH:  November 06, 1927  DATE OF PROCEDURE:  05/08/2011 DATE OF DISCHARGE:                              OPERATIVE REPORT   PREOPERATIVE DIAGNOSIS:  Spinal stenosis L3-4, status post L4-5 posterior lumbar interbody fusion.  POSTOPERATIVE DIAGNOSIS:  Spinal stenosis L3-4, status post L4-5 posterior lumbar interbody fusion.  PROCEDURE:  L3-4 decompressive laminectomy with decompression of L3 and L4 nerve roots more so than needed for posterior lumbar interbody fusion followed by L3-4 posterior lumbar interbody fusion with PEEK interbody spacer and Zimmer bony spacer followed by exploration of L4-5 fusion, removal of L5 pedicle screws and L3-4 nonsegmental instrumentation with Biomet pedicle screw system and then L3-L4 posterolateral fusion.  SURGEON:  Reinaldo Meeker, MD  ASSISTANT:  Dr. Marikay Alar.  PROCEDURE IN DETAIL:  After being placed in the prone position, the patient's back was prepped and draped in usual sterile fashion. Previous lumbar incision was opened up and extended superiorly.  We carried down to the spinous processes of L3 and residual of L4.  We did subperiosteal dissection along the spinous processes and lamina to identify the spinous process and lamina facet joint of L3-4 and the far lateral region to identify the transverse process of L3.  We identified and dissected free from soft tissue the previous instrumentation at L4- 5.  Self-retaining retractor was placed for exposure and x-rays showed approach to the appropriate level.  Top loading nuts of the pedicle screws at L4-5 were removed and the rod was removed.  We then removed the L5 pedicle screws bilaterally.  We then did a decompression at L3-4. Removed the spinous process.  Removed the entire L3 lamina, medial  three- quarters of facet joint, and any residual bone on the superior edge of L4 lamina.  L3 and L4 nerve roots were decompressed bilaterally more so than needed for posterior lumbar interbody fusion.  Then, incised the disk, thoroughly cleaned it out with pituitary rongeurs and curettes. At this time, we placed pedicle screws bilaterally at L3.  We used entry point for transverse process at the facet joint, passed a pedicle awl tapping the five 5-mm tap and placed 65 x 40 mm screws bilaterally. These were found to be in excellent position on AP and lateral fluoroscopy.  We then prepared the disk space for posterior lumbar interbody fusion.  We distracted up to a 10 mm size, then used a rotating cutter on the opposite side.  We placed a bony spacer on the first side, then removed the distractor.  On the opposite side, we used rotating cutter and prior to placing the second spacer we cleaned up the midline and placed a mixture of autologous bone graft and morselized allograft in the disk space to help with the interbody fusion.  Then, placed a PEEK interbody spacer of 10 x 9 mm size in the excellent position.  We then decorticated the far lateral region, did a posterolateral fusion at L3-4 with a mixture of autologous bone and morselized allograft.  We then placed top loading rod and secured with the top loading nuts and did final tightening with torque and counter torque.  Final fluoroscopy in the AP and lateral direction showed good placement of the screws and rods.  Irrigation was carried once more, any bleeding controlled with bipolar coagulation and Gelfoam.  An epidural drain was left in the epidural space and brought out through a separate stab wound incision. The wound was then closed in multiple layers of Vicryl and the muscle, fascia, subcutaneous and subcuticular tissue and staples were placed on the skin.  A sterile dressing was applied.  The patient was extubated and taken to  recovery room in stable condition.          ______________________________ Reinaldo Meeker, M.D.     ROK/MEDQ  D:  05/08/2011  T:  05/08/2011  Job:  [id number]  Electronically Signed by Aliene Beams M.D. on 05/12/2011 08:38:02 PM

## 2011-05-22 LAB — BASIC METABOLIC PANEL
BUN: 11
CO2: 26
GFR calc non Af Amer: >60
Glucose, Bld: 106 — ABNORMAL HIGH
Potassium: 4.3
Sodium: 138

## 2011-06-18 ENCOUNTER — Other Ambulatory Visit: Payer: Self-pay | Admitting: Neurosurgery

## 2011-06-18 ENCOUNTER — Ambulatory Visit
Admission: RE | Admit: 2011-06-18 | Discharge: 2011-06-18 | Disposition: A | Discharge status: Home / Self Care | Payer: Medicare Other | Source: Ambulatory Visit | Attending: Neurosurgery | Admitting: Neurosurgery

## 2011-06-18 DIAGNOSIS — M533 Sacrococcygeal disorders, not elsewhere classified: Secondary | ICD-10-CM

## 2011-06-18 DIAGNOSIS — M48061 Spinal stenosis, lumbar region without neurogenic claudication: Secondary | ICD-10-CM

## 2011-07-30 ENCOUNTER — Ambulatory Visit
Admission: RE | Admit: 2011-07-30 | Discharge: 2011-07-30 | Disposition: A | Discharge status: Home / Self Care | Payer: Medicare Other | Source: Ambulatory Visit | Attending: Neurosurgery | Admitting: Neurosurgery

## 2011-07-30 ENCOUNTER — Other Ambulatory Visit: Payer: Self-pay | Admitting: Neurosurgery

## 2011-07-30 DIAGNOSIS — M545 Low back pain: Secondary | ICD-10-CM

## 2011-12-09 ENCOUNTER — Other Ambulatory Visit (HOSPITAL_COMMUNITY): Payer: Self-pay | Admitting: Neurology

## 2011-12-09 DIAGNOSIS — R413 Other amnesia: Secondary | ICD-10-CM

## 2011-12-12 ENCOUNTER — Other Ambulatory Visit (HOSPITAL_COMMUNITY): Payer: Medicare Other

## 2011-12-16 ENCOUNTER — Encounter (HOSPITAL_COMMUNITY)
Admission: RE | Admit: 2011-12-16 | Discharge: 2011-12-16 | Disposition: A | Discharge status: Home / Self Care | Payer: Medicare Other | Source: Ambulatory Visit | Attending: Neurology | Admitting: Neurology

## 2011-12-16 DIAGNOSIS — R413 Other amnesia: Secondary | ICD-10-CM | POA: Insufficient documentation

## 2011-12-16 MED ORDER — FLUDEOXYGLUCOSE F - 18 (FDG) INJECTION
10.9000 | Freq: Once | INTRAVENOUS | Status: AC | PRN
  Administered 2011-12-16: 10.9 via INTRAVENOUS

## 2012-01-16 ENCOUNTER — Telehealth (HOSPITAL_COMMUNITY): Payer: Self-pay

## 2012-01-16 ENCOUNTER — Telehealth: Payer: Self-pay | Admitting: Diagnostic Radiology

## 2012-01-20 ENCOUNTER — Telehealth: Payer: Self-pay | Admitting: Neurology

## 2012-01-23 NOTE — Telephone Encounter (Signed)
Dr. Amil Amen called me about the PET scan result,but not on this date and I have no idea what this is about

## 2012-04-27 ENCOUNTER — Telehealth: Payer: Self-pay | Admitting: Diagnostic Radiology

## 2012-11-09 ENCOUNTER — Other Ambulatory Visit: Payer: Self-pay | Admitting: Family Medicine

## 2012-11-09 DIAGNOSIS — R269 Unspecified abnormalities of gait and mobility: Secondary | ICD-10-CM

## 2012-11-11 ENCOUNTER — Ambulatory Visit
Admission: RE | Admit: 2012-11-11 | Discharge: 2012-11-11 | Disposition: A | Discharge status: Home / Self Care | Payer: Medicare Other | Source: Ambulatory Visit | Attending: Family Medicine | Admitting: Family Medicine

## 2012-11-11 DIAGNOSIS — R269 Unspecified abnormalities of gait and mobility: Secondary | ICD-10-CM

## 2012-12-06 ENCOUNTER — Encounter (HOSPITAL_BASED_OUTPATIENT_CLINIC_OR_DEPARTMENT_OTHER): Payer: Self-pay | Admitting: Family Medicine

## 2012-12-06 ENCOUNTER — Emergency Department (HOSPITAL_BASED_OUTPATIENT_CLINIC_OR_DEPARTMENT_OTHER)
Admission: EM | Admit: 2012-12-06 | Discharge: 2012-12-06 | Disposition: A | Discharge status: Home / Self Care | Payer: Medicare Other | Attending: Emergency Medicine | Admitting: Emergency Medicine

## 2012-12-06 DIAGNOSIS — G309 Alzheimer's disease, unspecified: Secondary | ICD-10-CM | POA: Insufficient documentation

## 2012-12-06 DIAGNOSIS — Y92009 Unspecified place in unspecified non-institutional (private) residence as the place of occurrence of the external cause: Secondary | ICD-10-CM | POA: Insufficient documentation

## 2012-12-06 DIAGNOSIS — W19XXXA Unspecified fall, initial encounter: Secondary | ICD-10-CM

## 2012-12-06 DIAGNOSIS — Z818 Family history of other mental and behavioral disorders: Secondary | ICD-10-CM

## 2012-12-06 DIAGNOSIS — Y9389 Activity, other specified: Secondary | ICD-10-CM | POA: Insufficient documentation

## 2012-12-06 DIAGNOSIS — R296 Repeated falls: Secondary | ICD-10-CM | POA: Insufficient documentation

## 2012-12-06 DIAGNOSIS — Z880 Allergy status to penicillin: Secondary | ICD-10-CM | POA: Insufficient documentation

## 2012-12-06 DIAGNOSIS — Z79899 Other long term (current) drug therapy: Secondary | ICD-10-CM | POA: Insufficient documentation

## 2012-12-06 DIAGNOSIS — Z043 Encounter for examination and observation following other accident: Secondary | ICD-10-CM | POA: Insufficient documentation

## 2012-12-06 DIAGNOSIS — F0280 Dementia in other diseases classified elsewhere without behavioral disturbance: Secondary | ICD-10-CM | POA: Insufficient documentation

## 2012-12-06 HISTORY — DX: Dementia in other diseases classified elsewhere, unspecified severity, without behavioral disturbance, psychotic disturbance, mood disturbance, and anxiety: F02.80

## 2012-12-06 HISTORY — DX: Unspecified dementia, unspecified severity, without behavioral disturbance, psychotic disturbance, mood disturbance, and anxiety: F03.90

## 2012-12-06 HISTORY — DX: Alzheimer's disease, unspecified: G30.9

## 2012-12-06 LAB — BASIC METABOLIC PANEL WITH GFR
BUN: 14 mg/dL (ref 6–23)
CO2: 25 meq/L (ref 19–32)
Calcium: 9.4 mg/dL (ref 8.4–10.5)
Chloride: 103 meq/L (ref 96–112)
Creatinine, Ser: 0.8 mg/dL (ref 0.50–1.10)
GFR calc Af Amer: 77 mL/min — ABNORMAL LOW
GFR calc non Af Amer: 67 mL/min — ABNORMAL LOW
Glucose, Bld: 123 mg/dL — ABNORMAL HIGH (ref 70–99)
Potassium: 3.7 meq/L (ref 3.5–5.1)
Sodium: 140 meq/L (ref 135–145)

## 2012-12-06 LAB — URINALYSIS, ROUTINE W REFLEX MICROSCOPIC
Nitrite: NEGATIVE
Specific Gravity, Urine: 1.018 (ref 1.005–1.030)
Urobilinogen, UA: 0.2 mg/dL (ref 0.0–1.0)

## 2012-12-06 NOTE — ED Provider Notes (Signed)
Medical screening examination/treatment/procedure(s) were conducted as a shared visit with non-physician practitioner(s) and myself.  I personally evaluated the patient during the encounter  Patient seen by me. Patient here with her husband she was found on the kitchen floor no evidence of any direct trauma. Husband is familiar with her dementia the patient denies any pain basic lab workup is normal. Patient is stable to be discharged home.  Shelda Jakes, MD 12/06/12 1245

## 2012-12-06 NOTE — ED Provider Notes (Signed)
History     CSN: [id number]  Arrival date & time 12/06/12  0932   First MD Initiated Contact with Patient 12/06/12 [phone]      No chief complaint on file.   (Consider location/radiation/quality/duration/timing/severity/associated sxs/prior treatment) Patient is a 79 y.o. female presenting with fall. The history is provided by the patient and the spouse. No language interpreter was used.  Fall The accident occurred 1 to 2 hours ago. She was ambulatory at the scene. There was no entrapment after the fall. There was no drug use involved in the accident. There was no alcohol use involved in the accident. Associated symptoms comments: Per EMS report and per husband, patient had an unwitnessed fall this morning at home where she is cared for by her husband. She had a history of dementia but not of frequent falls. Found in the kitchen this morning after her husband heard her fall. He found a butcher block on wheels that sits in the room center slight moved and thought she lost her balance after leaning on it. However, he also reports recent diarrhea illness that has been mild and episodic vomiting that is longstanding. No recent illness or fever. She has been ambulatory after the event and does not complain of any pain..    Past Medical History  Diagnosis Date  . Dementia   . Alzheimer disease     History reviewed. No pertinent past surgical history.  History reviewed. No pertinent family history.  History  Substance Use Topics  . Smoking status: Not on file  . Smokeless tobacco: Not on file  . Alcohol Use: Not on file    OB History   Grav Para Term Preterm Abortions TAB SAB Ect Mult Living                  Review of Systems  Unable to perform ROS: Dementia    Allergies  Penicillins  Home Medications   Current Outpatient Rx  Name  Route  Sig  Dispense  Refill  . HYDROCHLOROTHIAZIDE PO   Oral   Take by mouth.         . MELOXICAM PO   Oral   Take by mouth.          . Memantine HCl (NAMENDA PO)   Oral   Take by mouth.         . Valsartan (DIOVAN PO)   Oral   Take by mouth.           BP 111/67  Pulse 88  Temp(Src) 98 F (36.7 C) (Oral)  Resp 16  SpO2 97%  Physical Exam  Constitutional: She appears well-developed and well-nourished.  HENT:  Head: Normocephalic.  Neck: Normal range of motion. Neck supple.  Cardiovascular: Normal rate and regular rhythm.   Pulmonary/Chest: Effort normal and breath sounds normal.  Abdominal: Soft. Bowel sounds are normal. There is no tenderness. There is no rebound and no guarding.  Musculoskeletal: Normal range of motion.  Neurological: She is alert. Coordination normal.  She is oriented to person but not time or place. Pleasantly demented.  Skin: Skin is warm and dry. No rash noted.  Psychiatric: She has a normal mood and affect.    ED Course  Procedures (including critical care time)  Labs Reviewed  BASIC METABOLIC PANEL - Abnormal; Notable for the following:    Glucose, Bld 123 (*)    GFR calc non Af Amer 67 (*)    GFR calc Af Amer 77 (*)  All other components within normal limits  URINALYSIS, ROUTINE W REFLEX MICROSCOPIC - Abnormal; Notable for the following:    APPearance CLOUDY (*)    All other components within normal limits  URINE CULTURE   Results for orders placed during the hospital encounter of 12/06/12  BASIC METABOLIC PANEL      Result Value Range   Sodium 140  135 - 145 mEq/L   Potassium 3.7  3.5 - 5.1 mEq/L   Chloride 103  96 - 112 mEq/L   CO2 25  19 - 32 mEq/L   Glucose, Bld 123 (*) 70 - 99 mg/dL   BUN 14  6 - 23 mg/dL   Creatinine, Ser 4.09  0.50 - 1.10 mg/dL   Calcium 9.4  8.4 - 81.1 mg/dL   GFR calc non Af Amer 67 (*) >90 mL/min   GFR calc Af Amer 77 (*) >90 mL/min  URINALYSIS, ROUTINE W REFLEX MICROSCOPIC      Result Value Range   Color, Urine YELLOW  YELLOW   APPearance CLOUDY (*) CLEAR   Specific Gravity, Urine 1.018  1.005 - 1.030   pH 6.0  5.0 - 8.0    Glucose, UA NEGATIVE  NEGATIVE mg/dL   Hgb urine dipstick NEGATIVE  NEGATIVE   Bilirubin Urine NEGATIVE  NEGATIVE   Ketones, ur NEGATIVE  NEGATIVE mg/dL   Protein, ur NEGATIVE  NEGATIVE mg/dL   Urobilinogen, UA 0.2  0.0 - 1.0 mg/dL   Nitrite NEGATIVE  NEGATIVE   Leukocytes, UA NEGATIVE  NEGATIVE    No results found.   No diagnosis found.  1. Fall 2. History of dementia   MDM  She has an unchanged mental status. Husband at bedside who states she is at her baseline function. Negative exam. Stable for discharge.        Arnoldo Hooker, PA-C 12/06/12 1248

## 2012-12-06 NOTE — ED Notes (Signed)
Per EMS, pt was found on kitchen floor by husband. Pt has dementia, denies pain, and was ambulatory upon EMS arrival. Pt had bowel movement at home prior to coming here.

## 2012-12-07 NOTE — ED Provider Notes (Signed)
Medical screening examination/treatment/procedure(s) were conducted as a shared visit with non-physician practitioner(s) and myself.  I personally evaluated the patient during the encounter  Zillah Alexie W. Wesleigh Markovic, MD 12/07/12 1511 

## 2012-12-08 LAB — URINE CULTURE

## 2013-02-21 ENCOUNTER — Ambulatory Visit: Payer: Self-pay | Admitting: Neurology

## 2013-03-10 ENCOUNTER — Encounter: Payer: Self-pay | Admitting: *Deleted

## 2013-03-14 ENCOUNTER — Encounter: Payer: Self-pay | Admitting: Cardiovascular Disease

## 2013-03-15 ENCOUNTER — Ambulatory Visit (INDEPENDENT_AMBULATORY_CARE_PROVIDER_SITE_OTHER): Payer: Medicare Other | Admitting: Cardiovascular Disease

## 2013-03-15 ENCOUNTER — Encounter: Payer: Self-pay | Admitting: Cardiovascular Disease

## 2013-03-15 VITALS — BP 130/84 | HR 101 | Resp 16 | Ht 63.0 in | Wt 137.0 lb

## 2013-03-15 DIAGNOSIS — I10 Essential (primary) hypertension: Secondary | ICD-10-CM

## 2013-03-15 NOTE — Patient Instructions (Addendum)
Your physician recommends that you schedule a follow-up appointment in: One year.  

## 2013-03-21 NOTE — Progress Notes (Signed)
Patient ID: [name], female   DOB: 06-Feb-1928, 79 y.o.   MRN: [id number]    Reason for office visit Hypertension, hyperlipidemia follow up  This is my first encounter with [name] [name]. She is here with her husband [name] [name] who is also our patient. They were both patients of Dr. Clarene Duke until his recent retirement.  [name] [name] has progressive dementia and it seems that her conversation is now limited to the words yes and no. She appears rather startle to be here but is pleasant and willing to cooperate. All the history review of systems is obtained from her family.  She has a history of hyperlipidemia but has been intolerant to numerous lipid lower medications including simvastatin Zetia atorvastatin or simvastatin and niacin. She also has systemic hypertension that has been well managed. There is a questionable history of stroke. CT of her head performed in the past shows chronic small vessel disease and age related atrophy.  According to the patient's husband the only problem is that she often throws up. I wonder if he is actually describing regurgitation. Often after she eats she'll begin coughing and then developed fluid. We discussed possibility of gastric esophageal reflux disease or a esophageal stricture as the cause of her symptoms. She seems to do better with thickened liquids. I've recommended that he consider a swallowing study.    Allergies  Allergen Reactions  . Penicillins Swelling  . Beta Adrenergic Blockers     Fatigue   . Lipitor (Atorvastatin)   . Vytorin (Ezetimibe-Simvastatin)   . Zetia (Ezetimibe)     Current Outpatient Prescriptions  Medication Sig Dispense Refill  . HYDROCHLOROTHIAZIDE PO Take 12.5 mg by mouth daily.       . MELOXICAM PO Take by mouth.      . Memantine HCl (NAMENDA PO) Take by mouth.      . Valsartan (DIOVAN PO) Take 160 mg by mouth daily.        No current facility-administered medications for this visit.    Past Medical History    Diagnosis Date  . Dementia   . Alzheimer disease   . Hypertension     Past Surgical History  Procedure Laterality Date  . Nm myocar perf wall motion  02/23/2009    No ischemia, EF 74%    Family History  Problem Relation Age of Onset  . Heart failure Father   . Cancer Mother   . Cancer Brother   . Hypertension Brother   . Cancer Sister     History   Social History  . Marital Status: Married    Spouse Name: N/A    Number of Children: N/A  . Years of Education: N/A   Occupational History  . Not on file.   Social History Main Topics  . Smoking status: Never Smoker   . Smokeless tobacco: Not on file  . Alcohol Use: No  . Drug Use: No  . Sexually Active: Not on file   Other Topics Concern  . Not on file   Social History Narrative  . No narrative on file    Review of systems: The patient's family specifically denies any chest pain at rest or with exertion, dyspnea at rest or with exertion, orthopnea, paroxysmal nocturnal dyspnea, syncope, palpitations, focal neurological deficits, intermittent claudication, lower extremity edema, unexplained weight gain, cough, hemoptysis or wheezing.  The patient also denies abdominal pain, nausea, vomiting, dysphagia, diarrhea, constipation, polyuria, polydipsia, dysuria, hematuria, frequency, urgency, abnormal bleeding or bruising, fever, chills, unexpected  weight changes, mood swings, change in skin or hair texture, change in voice quality, auditory or visual problems, allergic reactions or rashes, new musculoskeletal complaints other than usual "aches and pains".   PHYSICAL EXAM BP 130/84  Pulse 101  Resp 16  Ht 5\' 3"  (1.6 m)  Wt 137 lb (62.143 kg)  BMI 24.27 kg/m2  General: Alert, oriented x3, no distress Head: no evidence of trauma, PERRL, EOMI, no exophtalmos or lid lag, no myxedema, no xanthelasma; normal ears, nose and oropharynx Neck: normal jugular venous pulsations and no hepatojugular reflux; brisk carotid pulses  without delay and no carotid bruits Chest: clear to auscultation, no signs of consolidation by percussion or palpation, normal fremitus, symmetrical and full respiratory excursions Cardiovascular: normal position and quality of the apical impulse, regular rhythm, normal first and second heart sounds, no  rubs or gallops, grade 1/6 early peaking systolic ejection murmur Abdomen: no tenderness or distention, no masses by palpation, no abnormal pulsatility or arterial bruits, normal bowel sounds, no hepatosplenomegaly Extremities: no clubbing, cyanosis or edema; 2+ radial, ulnar and brachial pulses bilaterally; 2+ right femoral, posterior tibial and dorsalis pedis pulses; 2+ left femoral, posterior tibial and dorsalis pedis pulses; no subclavian or femoral bruits Neurological: grossly nonfocal   EKG: Sinus rhythm, normal tracing  Lipid Panel     Component Value Date/Time   CHOL  Value: 195        ATP III CLASSIFICATION:  <200     mg/dL   Desirable  161-096  mg/dL   Borderline High  >=045    mg/dL   High        40/04/8118 0505   TRIG 154* 06/13/2009 0505   HDL 28* 06/13/2009 0505   CHOLHDL 7.0 06/13/2009 0505   VLDL 31 06/13/2009 0505   LDLCALC  Value: 136        Total Cholesterol/HDL:CHD Risk Coronary Heart Disease Risk Table                     Men   Women  1/2 Average Risk   3.4   3.3  Average Risk       5.0   4.4  2 X Average Risk   9.6   7.1  3 X Average Risk  23.4   11.0        Use the calculated Patient Ratio above and the CHD Risk Table to determine the patient's CHD Risk.        ATP III CLASSIFICATION (LDL):  <100     mg/dL   Optimal  147-829  mg/dL   Near or Above                    Optimal  130-159  mg/dL   Borderline  562-130  mg/dL   High  >865     mg/dL   Very High* 78/11/6960 0505    BMET    Component Value Date/Time   NA 140 12/06/2012 1058   K 3.7 12/06/2012 1058   CL 103 12/06/2012 1058   CO2 25 12/06/2012 1058   GLUCOSE 123* 12/06/2012 1058   BUN 14 12/06/2012 1058   CREATININE 0.80  12/06/2012 1058   CALCIUM 9.4 12/06/2012 1058   GFRNONAA 67* 12/06/2012 1058   GFRAA 77* 12/06/2012 1058     ASSESSMENT AND PLAN  [name] [name] has mild dyslipidemia but is intolerance to numerous lipid-lowering medications. She has well-controlled hypertension. Workup in the past is demonstrated no evidence of  structural heart disease. Normal left ventricular ejection fraction. Mild aortic insufficiency aortic valve sclerosis. The echo report states that she has diastolic dysfunction but the diastolic annular velocities actually appeared to be normal for her age.   I am concerned that she has some problems with aspiration manifesting itself as choking and regurgitation. I recommended that she undergo a swallowing study. This may be part of her neurological deterioration. Orders Placed This Encounter  Procedures  . EKG 12-Lead   No orders of the defined types were placed in this encounter.    Junious Silk, MD, [facility] [phone] office [phone] pager

## 2013-06-27 IMAGING — CT CT L SPINE W/ CM
3 of 10 series · 9 of 27 positions shown, 10 images · IV contrast (omnipaque)
Comparison: MRI 02/06/2011.

MYELOGRAM INJECTION
TECHNIQUE: Informed consent was obtained from the patient prior to
the procedure, including potential complications of headache,
allergy, infection and pain. Specific instructions were given
regarding 24 hour bedrest post procedure to prevent post-LP
headache.  A timeout procedure was performed.  With the patient
prone, the lower back was prepped with Betadine.  1% Lidocaine was
used for local anesthesia.  Lumbar puncture was performed by the
radiologist at the L4-L5 level using a 22 gauge needle with return
of clear CSF.  15 cc of Omnipaque 180 was injected into the
subarachnoid space .
CLINICAL DATA: Low back pain without significant radicular
symptoms.  Previous fusion.
TECHNIQUE: Multidetector CT imaging of the lumbar spine was
performed following myelography.  Multiplanar CT image
reconstructions were also generated.

[Series 2: l spine bone · axial · 0.27mm/px · z∈[-2,+68]mm · 2 of 84 slices shown, 3 images]
[im 28/84  soft-tissue]
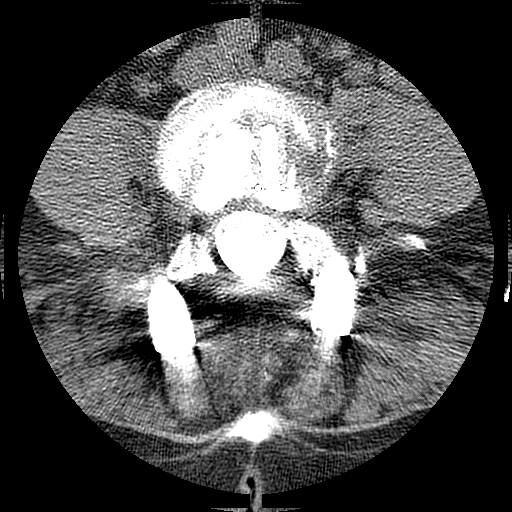
[im 28/84  bone]
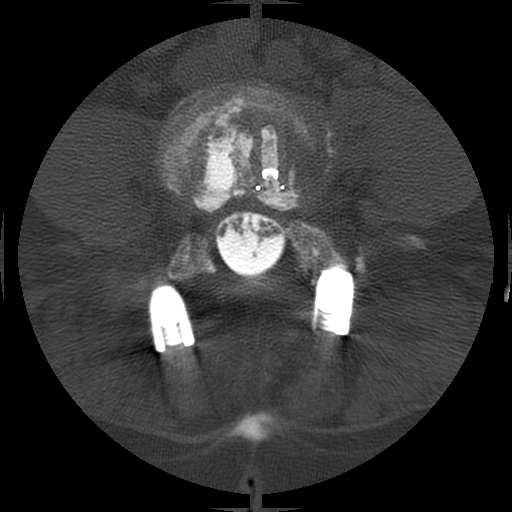
[im 56/84  bone]
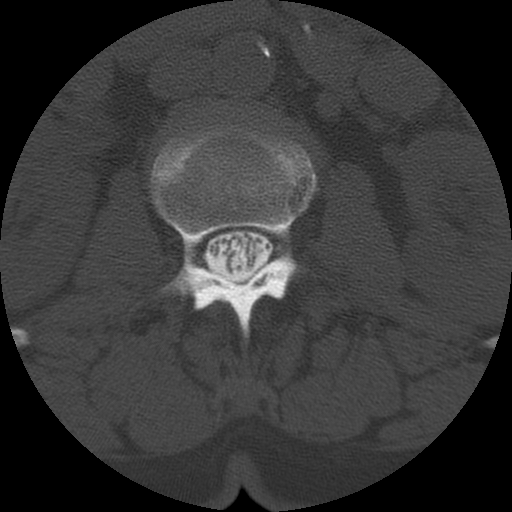

[Series 3: l spine soft · axial · 0.27mm/px · z∈[-2,+68]mm · 2 of 84 slices shown]
[im 28/84  soft-tissue]
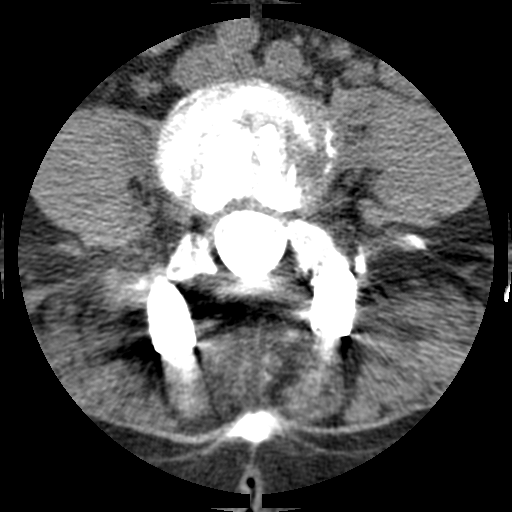
[im 56/84  soft-tissue]
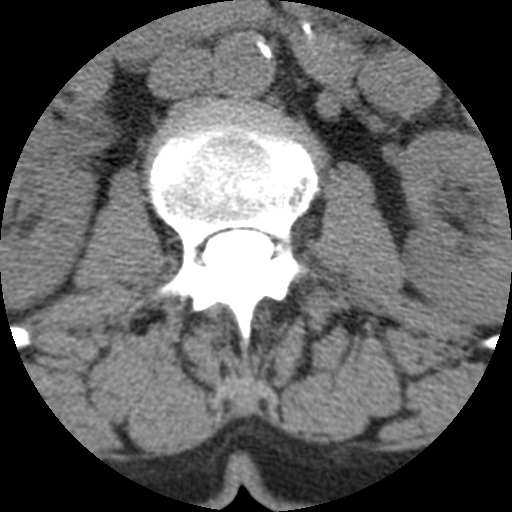

[Series 400: coronal · coronal · 0.42mm/px · 5 of 50 slices shown]
[im 9/50  bone]
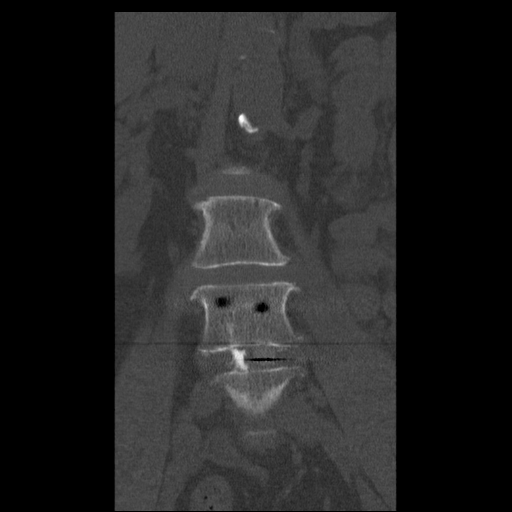
[im 17/50  bone]
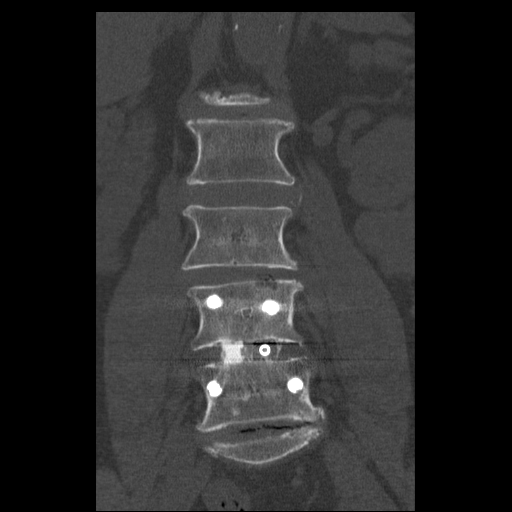
[im 25/50  bone]
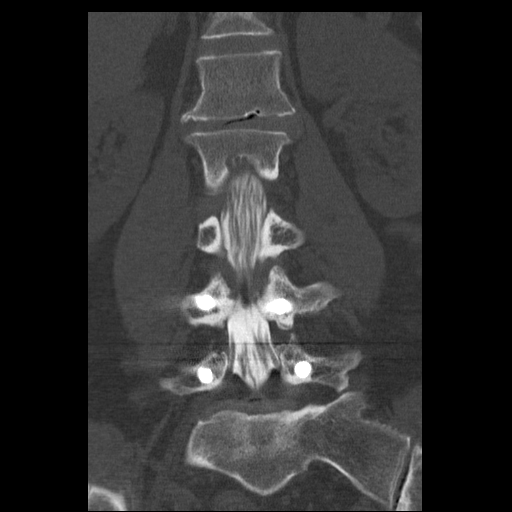
[im 33/50  bone]
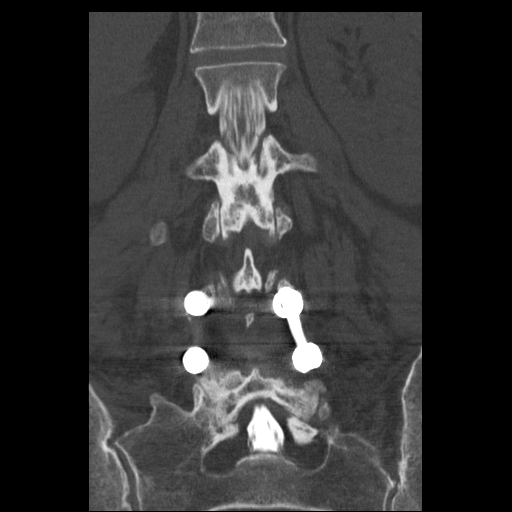
[im 41/50  bone]
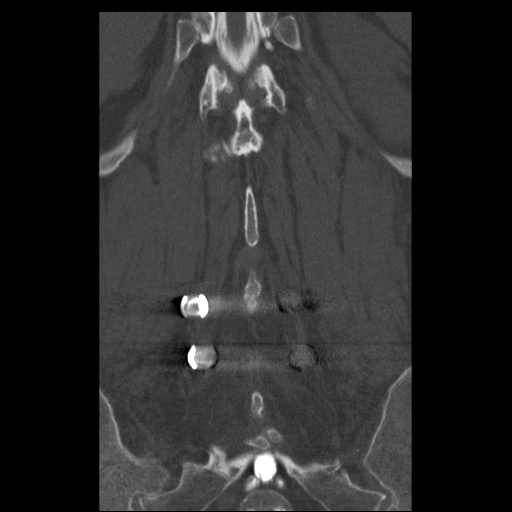

[9 of 27 positions shown; findings below may reference images not displayed]

IMPRESSION: Successful injection of  intrathecal contrast for myelography.

MYELOGRAM LUMBAR
FINDINGS: Good opacification lumbar subarachnoid space.  Solid
appearing L4-L5 fusion without residual neural compression.
Hardware intact.  Significant adjacent segment disease at L3-L4
with spinal stenosis and bilateral L4 nerve root encroachment.
This is worse with the patient standing.  Central disc protrusion
is associated with posterior element hypertrophy. Mild waist like
narrowing L5-S1 associated with disc space narrowing.  Anatomic
alignment with the patient prone for myelography, but with the
patient standing in flexion there is 2 mm anterolisthesis at L5-S1.
No other areas of abnormal motion are seen.

Fluoroscopy Time: 0.45 minutes
IMPRESSION: As above.

CT MYELOGRAPHY LUMBAR SPINE
FINDINGS: No prevertebral or paraspinous masses.  Nonaneurysmal
atherosclerotic calcification of the aorta.

L1-2: Conus minimally low.  Mild bulge.  Vacuum disc phenomenon
Schmorl's nodes.  No neural encroachment.

L2-3: Mild bulge.  Mild facet arthropathy.

L3-4: Moderate to severe stenosis secondary to central disc
protrusion and posterior element hypertrophy.  Bilateral L4 root
encroachment is present, left greater than right.  Disc material
extends into both neural foramina  affecting the L3 nerve roots,
worse on the left.

L4-5: Solid fusion.  No adverse features.

L5-S1: Advanced facet arthropathy.  Early fusion across the right
facet joint.  Central protrusion with vacuum disc phenomenon.
Vacuum disc extends upward as an extruded free fragment behind the
L5 vertebral body.  There is mild effacement both S1 nerve roots,
slightly worse on the left.  Disc material extends into the neural
foramina which show significant narrowing from bony overgrowth.
Bilateral L5 neural encroachment is likely.

Compared with prior MR, good general agreement.
IMPRESSION: Solid fusion L4-L5.

Severe adjacent segment disease at L3-4 with moderate to severe
stenosis secondary to central disc protrusion and posterior element
hypertrophy. Both L3 and both L4 nerve roots are affected.

Moderate adjacent segment disease at L5-S1  with central disc
extrusion and advanced facet arthropathy.  There is mild dynamic
instability at the L5-S1 level as well.

## 2013-06-27 IMAGING — CR DG MYELOGRAM LUMBAR
13 of 20 series · 13 of 20 positions shown · IV contrast (omnipaque)
Comparison: MRI 02/06/2011.

MYELOGRAM INJECTION
TECHNIQUE: Informed consent was obtained from the patient prior to
the procedure, including potential complications of headache,
allergy, infection and pain. Specific instructions were given
regarding 24 hour bedrest post procedure to prevent post-LP
headache.  A timeout procedure was performed.  With the patient
prone, the lower back was prepped with Betadine.  1% Lidocaine was
used for local anesthesia.  Lumbar puncture was performed by the
radiologist at the L4-L5 level using a 22 gauge needle with return
of clear CSF.  15 cc of Omnipaque 180 was injected into the
subarachnoid space .
CLINICAL DATA: Low back pain without significant radicular
symptoms.  Previous fusion.
TECHNIQUE: Multidetector CT imaging of the lumbar spine was
performed following myelography.  Multiplanar CT image
reconstructions were also generated.

[myelogram  white (1 of 10)]
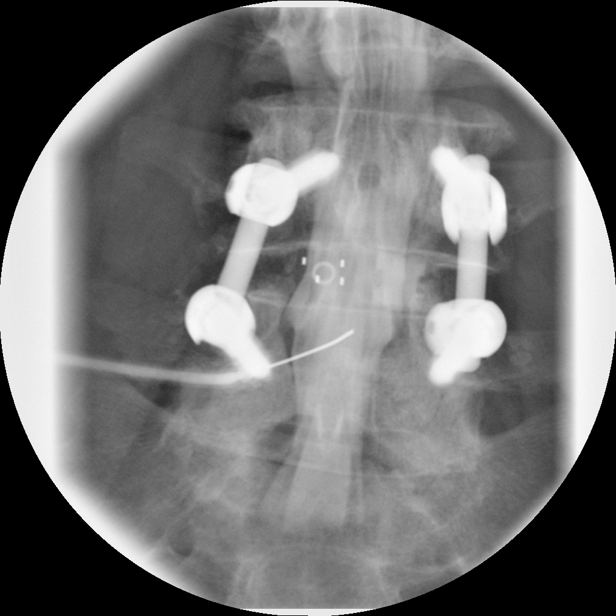

[myelogram  white (2 of 10)]
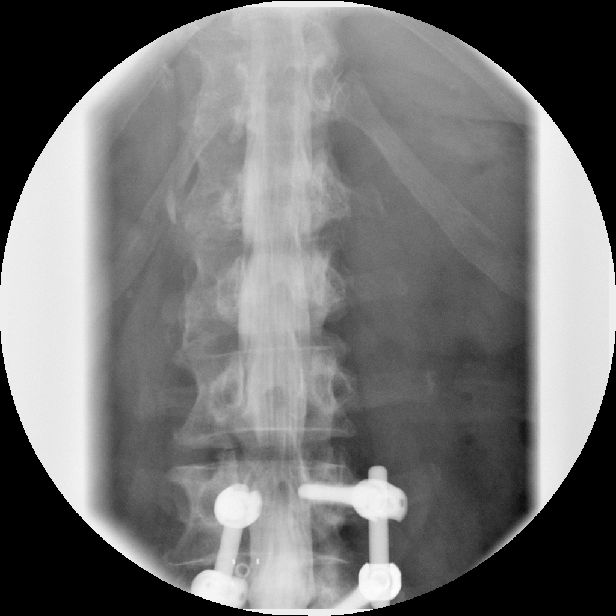

[myelogram  white (3 of 10)]
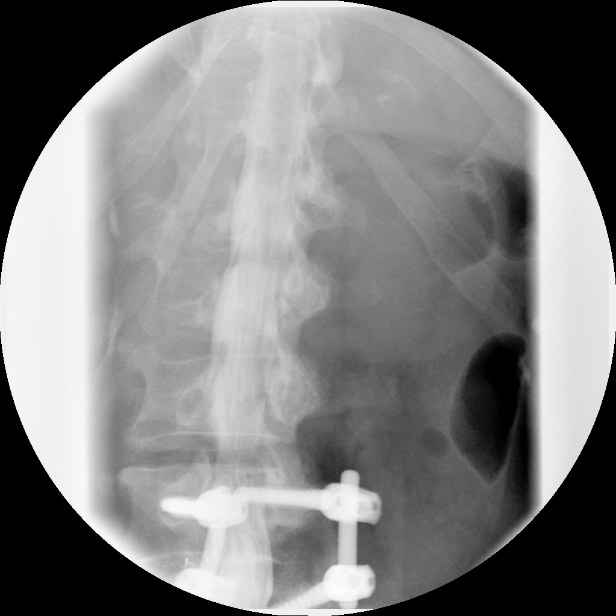

[myelogram  white (4 of 10)]
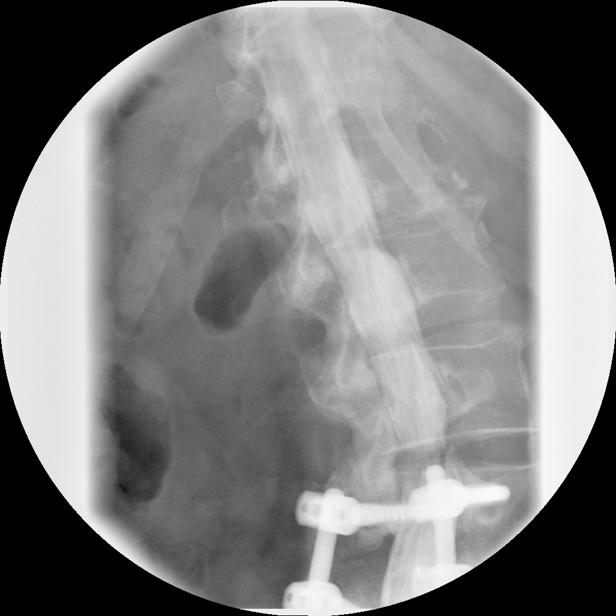

[myelogram  white (5 of 10)]
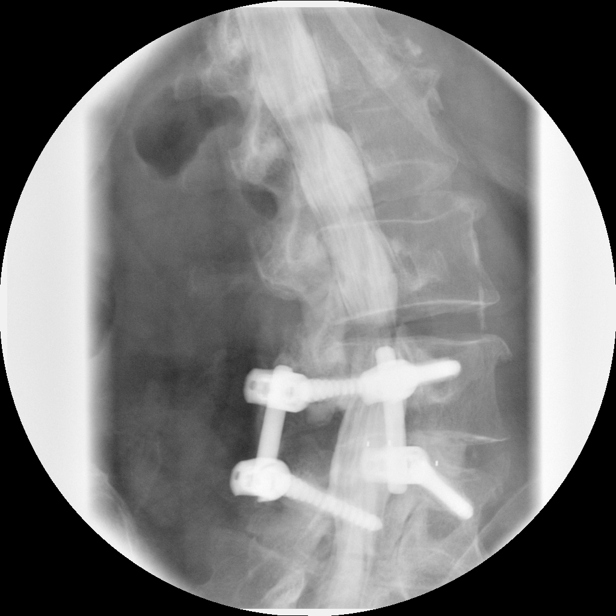

[myelogram  white (6 of 10)]
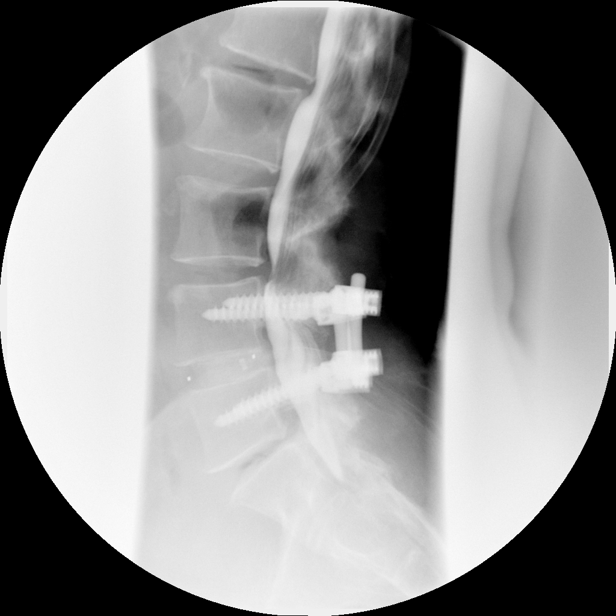

[myelogram  white (7 of 10)]
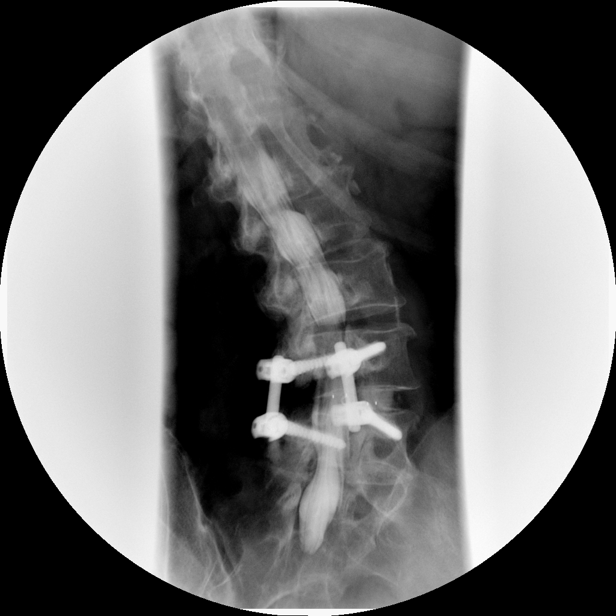

[myelogram  white (8 of 10)]
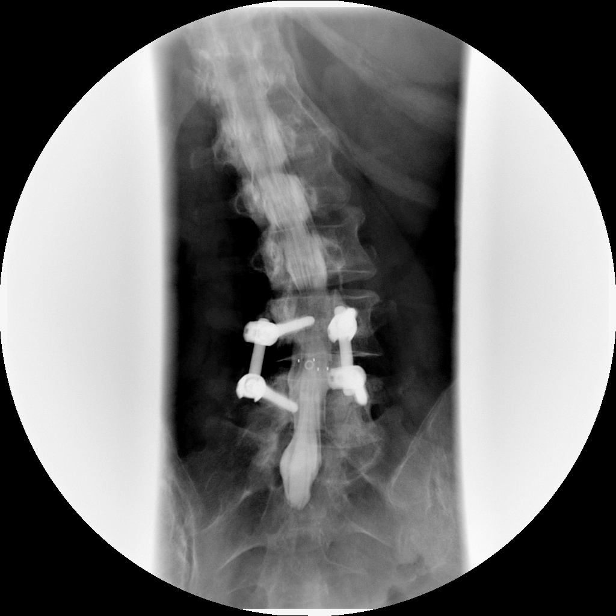

[myelogram  white (9 of 10)]
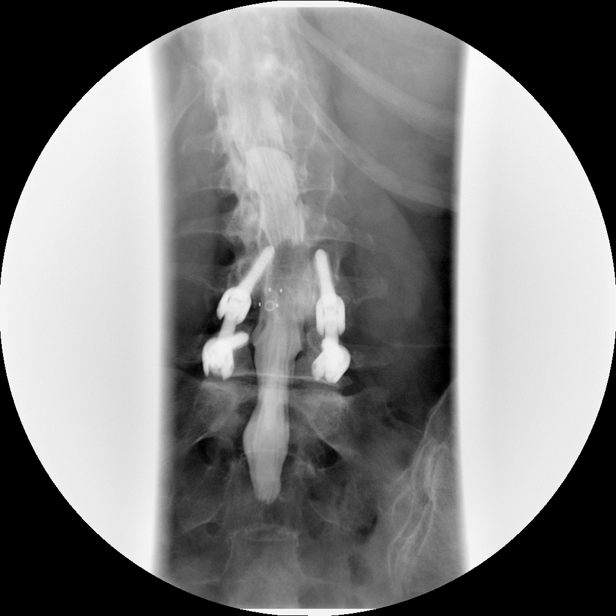

[myelogram  white (10 of 10)]
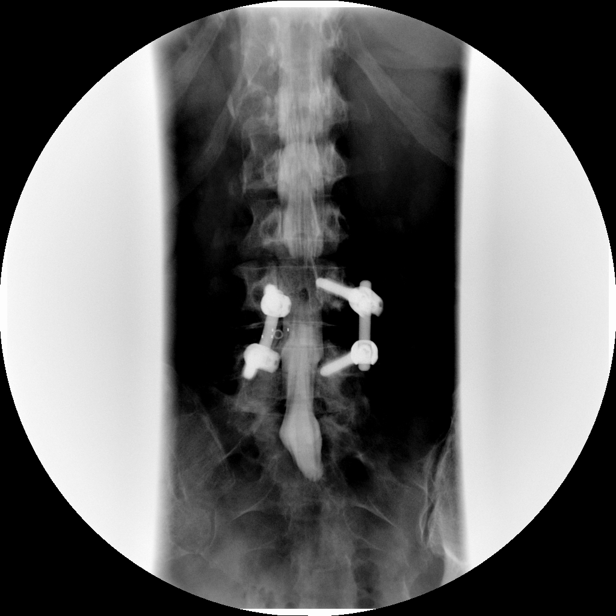

[view not recorded (1 of 3)]
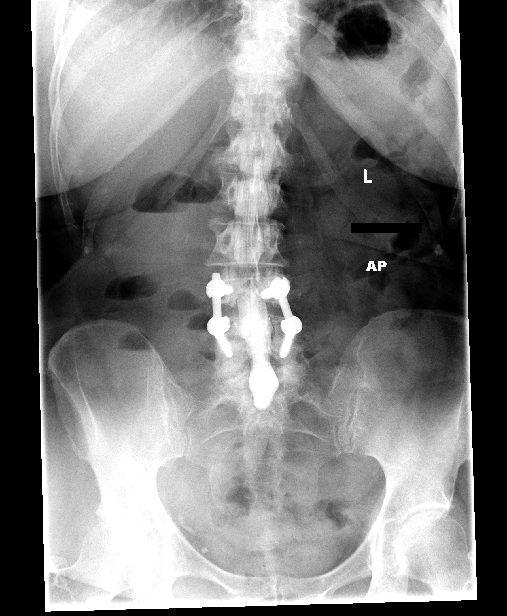

[view not recorded (2 of 3)]
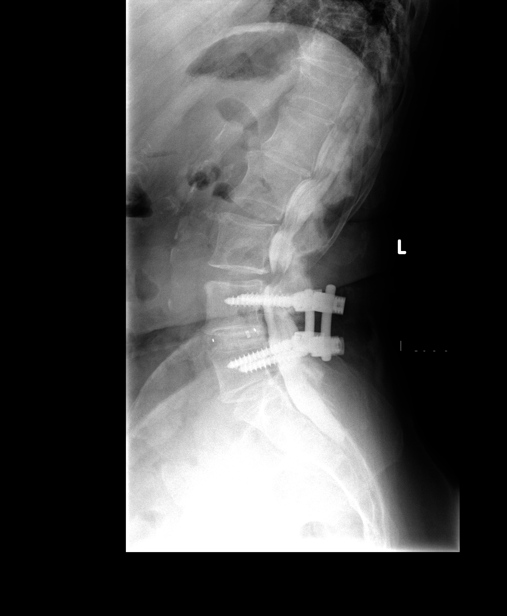

[view not recorded (3 of 3)]
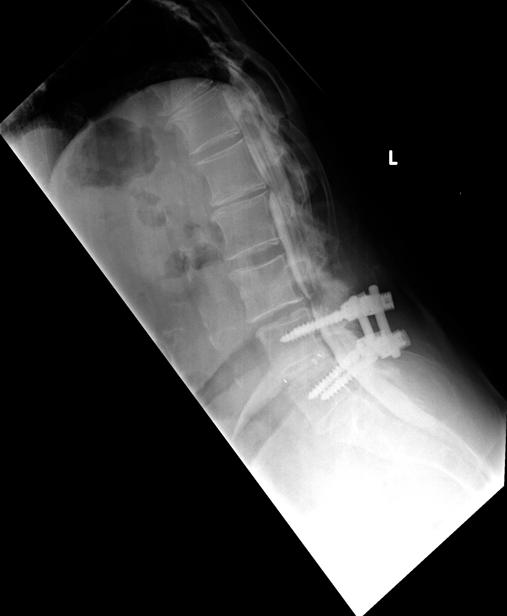

[13 of 20 positions shown; findings below may reference images not displayed]

IMPRESSION: Successful injection of  intrathecal contrast for myelography.

MYELOGRAM LUMBAR
FINDINGS: Good opacification lumbar subarachnoid space.  Solid
appearing L4-L5 fusion without residual neural compression.
Hardware intact.  Significant adjacent segment disease at L3-L4
with spinal stenosis and bilateral L4 nerve root encroachment.
This is worse with the patient standing.  Central disc protrusion
is associated with posterior element hypertrophy. Mild waist like
narrowing L5-S1 associated with disc space narrowing.  Anatomic
alignment with the patient prone for myelography, but with the
patient standing in flexion there is 2 mm anterolisthesis at L5-S1.
No other areas of abnormal motion are seen.

Fluoroscopy Time: 0.45 minutes
IMPRESSION: As above.

CT MYELOGRAPHY LUMBAR SPINE
FINDINGS: No prevertebral or paraspinous masses.  Nonaneurysmal
atherosclerotic calcification of the aorta.

L1-2: Conus minimally low.  Mild bulge.  Vacuum disc phenomenon
Schmorl's nodes.  No neural encroachment.

L2-3: Mild bulge.  Mild facet arthropathy.

L3-4: Moderate to severe stenosis secondary to central disc
protrusion and posterior element hypertrophy.  Bilateral L4 root
encroachment is present, left greater than right.  Disc material
extends into both neural foramina  affecting the L3 nerve roots,
worse on the left.

L4-5: Solid fusion.  No adverse features.

L5-S1: Advanced facet arthropathy.  Early fusion across the right
facet joint.  Central protrusion with vacuum disc phenomenon.
Vacuum disc extends upward as an extruded free fragment behind the
L5 vertebral body.  There is mild effacement both S1 nerve roots,
slightly worse on the left.  Disc material extends into the neural
foramina which show significant narrowing from bony overgrowth.
Bilateral L5 neural encroachment is likely.

Compared with prior MR, good general agreement.
IMPRESSION: Solid fusion L4-L5.

Severe adjacent segment disease at L3-4 with moderate to severe
stenosis secondary to central disc protrusion and posterior element
hypertrophy. Both L3 and both L4 nerve roots are affected.

Moderate adjacent segment disease at L5-S1  with central disc
extrusion and advanced facet arthropathy.  There is mild dynamic
instability at the L5-S1 level as well.

## 2014-07-23 ENCOUNTER — Emergency Department (HOSPITAL_BASED_OUTPATIENT_CLINIC_OR_DEPARTMENT_OTHER)
Admission: EM | Admit: 2014-07-23 | Discharge: 2014-07-24 | Disposition: A | Discharge status: Home / Self Care | Payer: Medicare Other | Attending: Emergency Medicine | Admitting: Emergency Medicine

## 2014-07-23 ENCOUNTER — Encounter (HOSPITAL_BASED_OUTPATIENT_CLINIC_OR_DEPARTMENT_OTHER): Payer: Self-pay | Admitting: *Deleted

## 2014-07-23 ENCOUNTER — Emergency Department (HOSPITAL_BASED_OUTPATIENT_CLINIC_OR_DEPARTMENT_OTHER): Payer: Medicare Other

## 2014-07-23 DIAGNOSIS — W19XXXA Unspecified fall, initial encounter: Secondary | ICD-10-CM | POA: Diagnosis not present

## 2014-07-23 DIAGNOSIS — S0081XA Abrasion of other part of head, initial encounter: Secondary | ICD-10-CM | POA: Diagnosis not present

## 2014-07-23 DIAGNOSIS — Z880 Allergy status to penicillin: Secondary | ICD-10-CM | POA: Diagnosis not present

## 2014-07-23 DIAGNOSIS — Y9389 Activity, other specified: Secondary | ICD-10-CM | POA: Diagnosis not present

## 2014-07-23 DIAGNOSIS — G309 Alzheimer's disease, unspecified: Secondary | ICD-10-CM | POA: Diagnosis not present

## 2014-07-23 DIAGNOSIS — I10 Essential (primary) hypertension: Secondary | ICD-10-CM | POA: Insufficient documentation

## 2014-07-23 DIAGNOSIS — Z79899 Other long term (current) drug therapy: Secondary | ICD-10-CM | POA: Insufficient documentation

## 2014-07-23 DIAGNOSIS — S0990XA Unspecified injury of head, initial encounter: Secondary | ICD-10-CM | POA: Diagnosis present

## 2014-07-23 DIAGNOSIS — F0280 Dementia in other diseases classified elsewhere without behavioral disturbance: Secondary | ICD-10-CM | POA: Diagnosis not present

## 2014-07-23 DIAGNOSIS — Y9289 Other specified places as the place of occurrence of the external cause: Secondary | ICD-10-CM | POA: Diagnosis not present

## 2014-07-23 DIAGNOSIS — Y998 Other external cause status: Secondary | ICD-10-CM | POA: Diagnosis not present

## 2014-07-23 DIAGNOSIS — F419 Anxiety disorder, unspecified: Secondary | ICD-10-CM | POA: Diagnosis not present

## 2014-07-23 DIAGNOSIS — S00211A Abrasion of right eyelid and periocular area, initial encounter: Secondary | ICD-10-CM | POA: Diagnosis not present

## 2014-07-23 NOTE — Discharge Instructions (Signed)
[name] [name] had a CT scan of her head in the Emergency Department.  Please apply antibiotic ointment to her face abrasions twice daily until healed.     Head Injury You have received a head injury. It does not appear serious at this time. Headaches and vomiting are common following head injury. It should be easy to awaken from sleeping. Sometimes it is necessary for you to stay in the emergency department for a while for observation. Sometimes admission to the hospital may be needed. After injuries such as yours, most problems occur within the first 24 hours, but side effects may occur up to 7-10 days after the injury. It is important for you to carefully monitor your condition and contact your health care provider or seek immediate medical care if there is a change in your condition. WHAT ARE THE TYPES OF HEAD INJURIES? Head injuries can be as minor as a bump. Some head injuries can be more severe. More severe head injuries include:  A jarring injury to the brain (concussion).  A bruise of the brain (contusion). This mean there is bleeding in the brain that can cause swelling.  A cracked skull (skull fracture).  Bleeding in the brain that collects, clots, and forms a bump (hematoma). WHAT CAUSES A HEAD INJURY? A serious head injury is most likely to happen to someone who is in a car wreck and is not wearing a seat belt. Other causes of major head injuries include bicycle or motorcycle accidents, sports injuries, and falls. HOW ARE HEAD INJURIES DIAGNOSED? A complete history of the event leading to the injury and your current symptoms will be helpful in diagnosing head injuries. Many times, pictures of the brain, such as CT or MRI are needed to see the extent of the injury. Often, an overnight hospital stay is necessary for observation.  WHEN SHOULD I SEEK IMMEDIATE MEDICAL CARE?  You should get help right away if:  You have confusion or drowsiness.  You feel sick to your stomach (nauseous) or  have continued, forceful vomiting.  You have dizziness or unsteadiness that is getting worse.  You have severe, continued headaches not relieved by medicine. Only take over-the-counter or prescription medicines for pain, fever, or discomfort as directed by your health care provider.  You do not have normal function of the arms or legs or are unable to walk.  You notice changes in the black spots in the center of the colored part of your eye (pupil).  You have a clear or bloody fluid coming from your nose or ears.  You have a loss of vision. During the next 24 hours after the injury, you must stay with someone who can watch you for the warning signs. This person should contact local emergency services (911 in the [place] if you have seizures, you become unconscious, or you are unable to wake up. HOW CAN I PREVENT A HEAD INJURY IN THE FUTURE? The most important factor for preventing major head injuries is avoiding motor vehicle accidents. To minimize the potential for damage to your head, it is crucial to wear seat belts while riding in motor vehicles. Wearing helmets while bike riding and playing collision sports (like football) is also helpful. Also, avoiding dangerous activities around the house will further help reduce your risk of head injury.  WHEN CAN I RETURN TO NORMAL ACTIVITIES AND ATHLETICS? You should be reevaluated by your health care provider before returning to these activities. If you have any of the following symptoms, you should  not return to activities or contact sports until 1 week after the symptoms have stopped:  Persistent headache.  Dizziness or vertigo.  Poor attention and concentration.  Confusion.  Memory problems.  Nausea or vomiting.  Fatigue or tire easily.  Irritability.  Intolerant of bright lights or loud noises.  Anxiety or depression.  Disturbed sleep. MAKE SURE YOU:   Understand these instructions.  Will watch your condition.  Will get  help right away if you are not doing well or get worse. Document Released: 07/28/2005 Document Revised: 08/02/2013 Document Reviewed: 04/04/2013 [facility] Patient Information 2015 [place] [place]. This information is not intended to replace advice given to you by your health care provider. Make sure you discuss any questions you have with your health care provider.

## 2014-07-23 NOTE — ED Provider Notes (Signed)
CSN: [id number]     Arrival date & time 07/23/14  2111 History  This chart was scribed for Tilden FossaElizabeth Emrie Gayle, MD by Gwenyth Oberatherine Macek, ED Scribe. This patient was seen in room MH12/MH12 and the patient's care was started at 9:49 PM.    Chief Complaint  Patient presents with  . Fall   HPI Comments: LEVEL 5 CAVEAT: DEMENTIA  The history is provided by the patient. No language interpreter was used.    LEVEL 5 CAVEAT: DEMENTIA  HPI Comments: [name] [name] is a 80 y.o. female with a history of dementia, Alzheimer's Disease and HTN who presents to the Emergency Department with an abrasion on the right side of her face. [facility] Per EMS, pt walked up to the staff with abrasion on her face, but there was no witnessed fall or injury. According to staff, she is acting at her baseline and is nonverbal.   Past Medical History  Diagnosis Date  . Dementia   . Alzheimer disease   . Hypertension    Past Surgical History  Procedure Laterality Date  . Nm myocar perf wall motion  02/23/2009    No ischemia, EF 74%   Family History  Problem Relation Age of Onset  . Heart failure Father   . Cancer Mother   . Cancer Brother   . Hypertension Brother   . Cancer Sister    History  Substance Use Topics  . Smoking status: Never Smoker   . Smokeless tobacco: Not on file  . Alcohol Use: No   OB History    No data available     Review of Systems  Unable to perform ROS: Dementia      Allergies  Penicillins; Beta adrenergic blockers; Lipitor; Vytorin; and Zetia  Home Medications   Prior to Admission medications   Medication Sig Start Date End Date Taking? Authorizing Provider  Memantine HCl (NAMENDA PO) Take by mouth.   Yes Historical Provider, MD  Multiple Vitamins-Minerals (MULTIVITAMIN WITH MINERALS) tablet Take 1 tablet by mouth daily.   Yes Historical Provider, MD  omeprazole (PRILOSEC) 20 MG capsule Take 20 mg by mouth daily.   Yes Historical  Provider, MD  Valsartan (DIOVAN PO) Take 160 mg by mouth daily.    Yes Historical Provider, MD  HYDROCHLOROTHIAZIDE PO Take 12.5 mg by mouth daily.     Historical Provider, MD  MELOXICAM PO Take by mouth.    Historical Provider, MD   BP 135/105 mmHg  Pulse 110  Temp(Src) 98.1 F (36.7 C) (Oral)  Resp 18  Ht 5\' 5"  (1.651 m)  Wt 140 lb (63.504 kg)  BMI 23.30 kg/m2  SpO2 95% Physical Exam  Constitutional: She appears well-developed and well-nourished.  HENT:  Abrasions to right periorbital region, right cheek  Eyes: Pupils are equal, round, and reactive to light.  Neck: Neck supple.  No c/t/l spine tenderness, scoliosis  Cardiovascular: Normal rate, regular rhythm and normal heart sounds.   No murmur heard. Pulmonary/Chest: Effort normal and breath sounds normal. No respiratory distress. She exhibits no tenderness.  Abdominal: Soft. There is no tenderness. There is no rebound and no guarding.  Neurological: She is alert.  Minimally verbal (only says no).  Moves all extremities symmetrically.  Follows very basic one step commands.    Skin: Skin is warm and dry.  Psychiatric:  Appears anxious  Nursing note and vitals reviewed.   ED Course  Procedures (including critical care time) DIAGNOSTIC STUDIES: Oxygen  Saturation is 95% on RA, adequate by my interpretation.    COORDINATION OF CARE: 9:50 PM Will order CT Head.   Labs Review Labs Reviewed - No data to display  Imaging Review Ct Head Wo Contrast  07/23/2014   CLINICAL DATA:  Head injury. Right facial abrasion. Initial encounter  EXAM: CT HEAD WITHOUT CONTRAST  TECHNIQUE: Contiguous axial images were obtained from the base of the skull through the vertex without intravenous contrast.  COMPARISON:  11/11/2012  FINDINGS: Mild motion degradation.  Skull and Sinuses:Negative for fracture or destructive process. The mastoids, middle ears, and imaged paranasal sinuses are clear.  Orbits: No acute abnormality.  Brain: No evidence  of acute infarction, hemorrhage, hydrocephalus, or mass lesion/mass effect.  There is brain atrophy, especially prominent in the bilateral frontal and temporal lobes. The severity and pattern is stable from 2014. Chronic small vessel ischemia with gliosis confluent around the frontal horns of the lateral ventricles.  IMPRESSION: 1. No acute intracranial injury or fracture. 2. Brain atrophy and white matter disease, stable from 2014.   Electronically Signed   By: Tiburcio PeaJonathan  Watts M.D.   On: 07/23/2014 23:03     EKG Interpretation None      MDM   Final diagnoses:  Head injury  Facial abrasion, initial encounter   Pt here with face abrasion, complete hx not available.  It is unclear how this was sustained but there is no additional evidence of injuries.  Plan to d/c back to facility with local wound care.    I personally performed the services described in this documentation, which was scribed in my presence. The recorded information has been reviewed and is accurate.      Tilden FossaElizabeth Korby Ratay, MD 07/24/14 [phone]

## 2014-07-23 NOTE — ED Notes (Signed)
Pt was moved to room 12 for better visiblity so that nurses can see her better due to high fall risk
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
## 2014-10-04 ENCOUNTER — Encounter (HOSPITAL_BASED_OUTPATIENT_CLINIC_OR_DEPARTMENT_OTHER): Payer: Self-pay | Admitting: Emergency Medicine

## 2014-10-04 ENCOUNTER — Emergency Department (HOSPITAL_BASED_OUTPATIENT_CLINIC_OR_DEPARTMENT_OTHER): Payer: Medicare Other

## 2014-10-04 ENCOUNTER — Emergency Department (HOSPITAL_BASED_OUTPATIENT_CLINIC_OR_DEPARTMENT_OTHER)
Admission: EM | Admit: 2014-10-04 | Discharge: 2014-10-04 | Disposition: A | Discharge status: Home / Self Care | Payer: Medicare Other | Attending: Emergency Medicine | Admitting: Emergency Medicine

## 2014-10-04 DIAGNOSIS — S0083XA Contusion of other part of head, initial encounter: Secondary | ICD-10-CM | POA: Insufficient documentation

## 2014-10-04 DIAGNOSIS — Z880 Allergy status to penicillin: Secondary | ICD-10-CM | POA: Diagnosis not present

## 2014-10-04 DIAGNOSIS — W19XXXA Unspecified fall, initial encounter: Secondary | ICD-10-CM

## 2014-10-04 DIAGNOSIS — Y998 Other external cause status: Secondary | ICD-10-CM | POA: Diagnosis not present

## 2014-10-04 DIAGNOSIS — W06XXXA Fall from bed, initial encounter: Secondary | ICD-10-CM | POA: Insufficient documentation

## 2014-10-04 DIAGNOSIS — S0990XA Unspecified injury of head, initial encounter: Secondary | ICD-10-CM

## 2014-10-04 DIAGNOSIS — I10 Essential (primary) hypertension: Secondary | ICD-10-CM | POA: Diagnosis not present

## 2014-10-04 DIAGNOSIS — Y92122 Bedroom in nursing home as the place of occurrence of the external cause: Secondary | ICD-10-CM | POA: Insufficient documentation

## 2014-10-04 DIAGNOSIS — G309 Alzheimer's disease, unspecified: Secondary | ICD-10-CM | POA: Insufficient documentation

## 2014-10-04 DIAGNOSIS — F0280 Dementia in other diseases classified elsewhere without behavioral disturbance: Secondary | ICD-10-CM | POA: Insufficient documentation

## 2014-10-04 DIAGNOSIS — Y9389 Activity, other specified: Secondary | ICD-10-CM | POA: Insufficient documentation

## 2014-10-04 DIAGNOSIS — Z79899 Other long term (current) drug therapy: Secondary | ICD-10-CM | POA: Diagnosis not present

## 2014-10-04 DIAGNOSIS — S80211A Abrasion, right knee, initial encounter: Secondary | ICD-10-CM | POA: Insufficient documentation

## 2014-10-04 NOTE — ED Notes (Signed)
[facility] called for transport 

## 2014-10-04 NOTE — ED Notes (Signed)
Family at bedside. 

## 2014-10-04 NOTE — ED Notes (Signed)
amb with walker  w/o difficulty or assist

## 2014-10-04 NOTE — ED Provider Notes (Signed)
CSN: [id number]     Arrival date & time 10/04/14  2059 History  This chart was scribe for No att. providers found by Angelene Giovanni, ED Scribe. The patient was seen in room MHOTF/OTF and the patient's care was started at 9:05 PM.    Chief Complaint  Patient presents with  . Fall   The history is provided by the spouse. The history is limited by the condition of the patient.   HPI Comments: [name] [name] is a 81 y.o. female with a hx of dementia an Alzeheimer disease who presents to the Emergency Department states post fall that occurred earlier today. Her husband states that she feel out of the bed and hit her head on the floor. He reports that she uses a walker to ambulate. The pt does not speak and her husband states that this is her baseline. There is an abrasion on her right knee and a hematoma on her right forehead.   Past Medical History  Diagnosis Date  . Dementia   . Alzheimer disease   . Hypertension    Past Surgical History  Procedure Laterality Date  . Nm myocar perf wall motion  02/23/2009    No ischemia, EF 74%   Family History  Problem Relation Age of Onset  . Heart failure Father   . Cancer Mother   . Cancer Brother   . Hypertension Brother   . Cancer Sister    History  Substance Use Topics  . Smoking status: Never Smoker   . Smokeless tobacco: Not on file  . Alcohol Use: No   OB History    No data available     Review of Systems  Unable to perform ROS: Patient nonverbal      Allergies  Penicillins; Beta adrenergic blockers; Lipitor; Vytorin; and Zetia  Home Medications   Prior to Admission medications   Medication Sig Start Date End Date Taking? Authorizing Provider  HYDROCHLOROTHIAZIDE PO Take 12.5 mg by mouth daily.     Historical Provider, MD  MELOXICAM PO Take by mouth.    Historical Provider, MD  Memantine HCl (NAMENDA PO) Take by mouth.    Historical Provider, MD  Multiple Vitamins-Minerals (MULTIVITAMIN WITH MINERALS) tablet Take 1  tablet by mouth daily.    Historical Provider, MD  omeprazole (PRILOSEC) 20 MG capsule Take 20 mg by mouth daily.    Historical Provider, MD  Valsartan (DIOVAN PO) Take 160 mg by mouth daily.     Historical Provider, MD   BP 133/71 mmHg  Pulse 100  Temp(Src) 98 F (36.7 C) (Oral)  Resp 16  SpO2 96% Physical Exam  Constitutional: She appears well-developed and well-nourished. No distress.  nonverbal  HENT:  Head: Normocephalic and atraumatic.  Mouth/Throat: Oropharynx is clear and moist. No oropharyngeal exudate.  Eyes: Conjunctivae and EOM are normal. Pupils are equal, round, and reactive to light.  Neck: Normal range of motion. Neck supple.  No meningismus.  Cardiovascular: Normal rate, regular rhythm, normal heart sounds and intact distal pulses.   No murmur heard. Pulmonary/Chest: Effort normal and breath sounds normal. No respiratory distress.  Abdominal: Soft. There is no tenderness. There is no rebound and no guarding.  Musculoskeletal: Normal range of motion. She exhibits no edema or tenderness.  Neurological: She is alert. No cranial nerve deficit. She exhibits normal muscle tone. Coordination normal.  Moving all extremities.  Follows some commands  Skin: Skin is warm.  No C spine tenderness  Abrasion right knee Pelvis stable No  T/L tenderness  Hematoma right forehead  Psychiatric: She has a normal mood and affect. Her behavior is normal.  Nursing note and vitals reviewed.   ED Course  Procedures (including critical care time) DIAGNOSTIC STUDIES: Oxygen Saturation is 93% on RA, low by my interpretation.    COORDINATION OF CARE: 9:10 PM- Pt advised of plan for treatment and pt agrees.    Labs Review Labs Reviewed - No data to display  Imaging Review Dg Pelvis 1-2 Views  10/04/2014   CLINICAL DATA:  Fall  EXAM: PELVIS - 1-2 VIEW  COMPARISON:  None.  FINDINGS: No fracture or dislocation is seen.  Mild degenerative changes the bilateral hips.  Visualized bony  pelvis appears intact.  Lumbar spine fixation hardware at L3-4 with interbody spacer at L4-5.  IMPRESSION: No fracture or dislocation is seen.   Electronically Signed   By: Charline Bills M.D.   On: 10/04/2014 22:12   Ct Head Wo Contrast  10/04/2014   CLINICAL DATA:  Fall, hematoma to forehead, patient is nonverbal, dementia  EXAM: CT HEAD WITHOUT CONTRAST  CT CERVICAL SPINE WITHOUT CONTRAST  TECHNIQUE: Multidetector CT imaging of the head and cervical spine was performed following the standard protocol without intravenous contrast. Multiplanar CT image reconstructions of the cervical spine were also generated.  COMPARISON:  None.  FINDINGS: CT HEAD FINDINGS  No intracranial hemorrhage. No parenchymal contusion. No midline shift or mass effect. Basilar cisterns are patent. No skull base fracture. No fluid in the paranasal sinuses or mastoid air cells. Orbits are normal.  Small scalp hematoma anterior to the right frontal bone.  There is extensive cortical atrophy with a frontal lobe predominance. Periventricular white matter hypodensities noted.  CT CERVICAL SPINE FINDINGS  No prevertebral soft tissue swelling. Normal alignment of cervical vertebral bodies. No loss of vertebral body height. Normal facet articulation. Normal craniocervical junction.  There is multiple levels of endplate spurring and disc space narrowing.  No evidence epidural or paraspinal hematoma.  IMPRESSION: 1. Small right frontal scalp hematoma. 2. No intracranial trauma. 3. Significant cortical atrophy and moderate white matter microvascular disease. 4. No cervical spine fracture. 5. Multilevel disc osteophytic disease.   Electronically Signed   By: Genevive Bi M.D.   On: 10/04/2014 22:06   Ct Cervical Spine Wo Contrast  10/04/2014   CLINICAL DATA:  Fall, hematoma to forehead, patient is nonverbal, dementia  EXAM: CT HEAD WITHOUT CONTRAST  CT CERVICAL SPINE WITHOUT CONTRAST  TECHNIQUE: Multidetector CT imaging of the head and  cervical spine was performed following the standard protocol without intravenous contrast. Multiplanar CT image reconstructions of the cervical spine were also generated.  COMPARISON:  None.  FINDINGS: CT HEAD FINDINGS  No intracranial hemorrhage. No parenchymal contusion. No midline shift or mass effect. Basilar cisterns are patent. No skull base fracture. No fluid in the paranasal sinuses or mastoid air cells. Orbits are normal.  Small scalp hematoma anterior to the right frontal bone.  There is extensive cortical atrophy with a frontal lobe predominance. Periventricular white matter hypodensities noted.  CT CERVICAL SPINE FINDINGS  No prevertebral soft tissue swelling. Normal alignment of cervical vertebral bodies. No loss of vertebral body height. Normal facet articulation. Normal craniocervical junction.  There is multiple levels of endplate spurring and disc space narrowing.  No evidence epidural or paraspinal hematoma.  IMPRESSION: 1. Small right frontal scalp hematoma. 2. No intracranial trauma. 3. Significant cortical atrophy and moderate white matter microvascular disease. 4. No cervical spine fracture. 5. Multilevel  disc osteophytic disease.   Electronically Signed   By: Stewart  Edmunds M.D.   On: 10/04/2014 2Genevive Bi2:06   Dg Knee Complete 4 Views Right  10/04/2014   CLINICAL DATA:  Fall, knee abrasion  EXAM: RIGHT KNEE - COMPLETE 4+ VIEW  COMPARISON:  None.  FINDINGS: No fracture or dislocation is seen.  Mild degenerative changes with lateral compartment chondrocalcinosis.  No suprapatellar knee joint effusion.  No radiopaque foreign body is seen.  IMPRESSION: No fracture, dislocation, or radiopaque foreign body is seen.  Mild degenerative changes.   Electronically Signed   By: Charline BillsSriyesh  Krishnan M.D.   On: 10/04/2014 22:11     EKG Interpretation None      MDM   Final diagnoses:  Fall  Head injury, initial encounter   from nursing home with unwitnessed fall. Nonverbal at baseline. Hematoma to  forehead.  Husband at bedside states patient at her baseline.  Abrasions to knees. No other apparent injuries.  CTs head and C-spine negative. X-rays unremarkable for acute traumatic injury.  Patient is able to ambulatory with a walker which is her baseline. She appears stable to return to nursing home.  Glynn OctaveStephen Quinlin Conant, MD 10/05/14 [phone]

## 2014-10-04 NOTE — Discharge Instructions (Signed)

## 2014-10-04 NOTE — ED Notes (Signed)
Patient arrived via EMS - The patient fell when getting into the bed. The patient has history of frequent falls. The patient has an abrasion to her right knee and hematoma to her right forehead

## 2015-03-08 ENCOUNTER — Encounter: Payer: Self-pay | Admitting: Internal Medicine

## 2016-09-01 ENCOUNTER — Emergency Department (HOSPITAL_BASED_OUTPATIENT_CLINIC_OR_DEPARTMENT_OTHER): Payer: Medicare Other

## 2016-09-01 ENCOUNTER — Emergency Department (HOSPITAL_BASED_OUTPATIENT_CLINIC_OR_DEPARTMENT_OTHER)
Admission: EM | Admit: 2016-09-01 | Discharge: 2016-09-01 | Disposition: A | Discharge status: Home / Self Care | Payer: Medicare Other | Attending: Emergency Medicine | Admitting: Emergency Medicine

## 2016-09-01 ENCOUNTER — Encounter (HOSPITAL_BASED_OUTPATIENT_CLINIC_OR_DEPARTMENT_OTHER): Payer: Self-pay | Admitting: *Deleted

## 2016-09-01 DIAGNOSIS — Y929 Unspecified place or not applicable: Secondary | ICD-10-CM | POA: Diagnosis not present

## 2016-09-01 DIAGNOSIS — G309 Alzheimer's disease, unspecified: Secondary | ICD-10-CM | POA: Insufficient documentation

## 2016-09-01 DIAGNOSIS — Y999 Unspecified external cause status: Secondary | ICD-10-CM | POA: Diagnosis not present

## 2016-09-01 DIAGNOSIS — S0083XA Contusion of other part of head, initial encounter: Secondary | ICD-10-CM | POA: Diagnosis not present

## 2016-09-01 DIAGNOSIS — I10 Essential (primary) hypertension: Secondary | ICD-10-CM | POA: Diagnosis not present

## 2016-09-01 DIAGNOSIS — Z79899 Other long term (current) drug therapy: Secondary | ICD-10-CM | POA: Insufficient documentation

## 2016-09-01 DIAGNOSIS — Y939 Activity, unspecified: Secondary | ICD-10-CM | POA: Insufficient documentation

## 2016-09-01 DIAGNOSIS — R000 Tachycardia, unspecified: Secondary | ICD-10-CM | POA: Insufficient documentation

## 2016-09-01 DIAGNOSIS — W19XXXA Unspecified fall, initial encounter: Secondary | ICD-10-CM

## 2016-09-01 DIAGNOSIS — W050XXA Fall from non-moving wheelchair, initial encounter: Secondary | ICD-10-CM | POA: Insufficient documentation

## 2016-09-01 DIAGNOSIS — N3000 Acute cystitis without hematuria: Secondary | ICD-10-CM | POA: Diagnosis not present

## 2016-09-01 DIAGNOSIS — S0990XA Unspecified injury of head, initial encounter: Secondary | ICD-10-CM | POA: Diagnosis present

## 2016-09-01 HISTORY — DX: Gastro-esophageal reflux disease without esophagitis: K21.9

## 2016-09-01 HISTORY — DX: Sleep disorder, unspecified: G47.9

## 2016-09-01 LAB — URINALYSIS, ROUTINE W REFLEX MICROSCOPIC
Bilirubin Urine: NEGATIVE
Glucose, UA: NEGATIVE mg/dL
Ketones, ur: NEGATIVE mg/dL
Nitrite: POSITIVE — AB
Protein, ur: 30 mg/dL — AB
Specific Gravity, Urine: 1.01 (ref 1.005–1.030)
pH: 7.5 (ref 5.0–8.0)

## 2016-09-01 LAB — CBC WITH DIFFERENTIAL/PLATELET
Basophils Absolute: 0 10*3/uL (ref 0.0–0.1)
Basophils Relative: 0 %
Eosinophils Absolute: 0.1 10*3/uL (ref 0.0–0.7)
Eosinophils Relative: 0 %
HCT: 39.4 % (ref 36.0–46.0)
Hemoglobin: 12.9 g/dL (ref 12.0–15.0)
Lymphocytes Relative: 10 %
Lymphs Abs: 1.2 10*3/uL (ref 0.7–4.0)
MCH: 28.7 pg (ref 26.0–34.0)
MCHC: 32.7 g/dL (ref 30.0–36.0)
MCV: 87.8 fL (ref 78.0–100.0)
Monocytes Absolute: 0.7 10*3/uL (ref 0.1–1.0)
Monocytes Relative: 6 %
Neutro Abs: 10.5 10*3/uL — ABNORMAL HIGH (ref 1.7–7.7)
Neutrophils Relative %: 84 %
Platelets: 295 10*3/uL (ref 150–400)
RBC: 4.49 MIL/uL (ref 3.87–5.11)
RDW: 13.8 % (ref 11.5–15.5)
WBC: 12.5 10*3/uL — ABNORMAL HIGH (ref 4.0–10.5)

## 2016-09-01 LAB — URINALYSIS, MICROSCOPIC (REFLEX)

## 2016-09-01 LAB — BASIC METABOLIC PANEL
Anion gap: 8 (ref 5–15)
BUN: 20 mg/dL (ref 6–20)
CO2: 28 mmol/L (ref 22–32)
Calcium: 9 mg/dL (ref 8.9–10.3)
Chloride: 102 mmol/L (ref 101–111)
Creatinine, Ser: 0.58 mg/dL (ref 0.44–1.00)
GFR calc Af Amer: >60 mL/min (ref >60)
GFR calc non Af Amer: >60 mL/min (ref >60)
Glucose, Bld: 124 mg/dL — ABNORMAL HIGH (ref 65–99)
Potassium: 4.1 mmol/L (ref 3.5–5.1)
Sodium: 138 mmol/L (ref 135–145)

## 2016-09-01 MED ORDER — CIPROFLOXACIN IN D5W 400 MG/200ML IV SOLN
400.0000 mg | Freq: Once | INTRAVENOUS | Status: AC
  Administered 2016-09-01: 400 mg via INTRAVENOUS
  Filled 2016-09-01: qty 200

## 2016-09-01 MED ORDER — CIPROFLOXACIN HCL 500 MG PO TABS: 500.0000 mg | ORAL_TABLET | Freq: Two times a day (BID) | ORAL | 0 refills | Status: AC

## 2016-09-01 MED ORDER — SODIUM CHLORIDE 0.9 % IV BOLUS (SEPSIS)
1000.0000 mL | Freq: Once | INTRAVENOUS | Status: AC
  Administered 2016-09-01: 1000 mL via INTRAVENOUS

## 2016-09-01 NOTE — ED Notes (Signed)
This RN has spoken with Durward Mallardamille, [facility]. I will report back the information given.

## 2016-09-01 NOTE — ED Provider Notes (Signed)
[facility] Provider Note   CSN: [id number] Arrival date & time: 09/01/16  0945     History   Chief Complaint Chief Complaint  Patient presents with  . Fall   Level 5 caveat due to dementia. HPI [name] [name] is a 83 y.o. female.  HPI Patient presents with fall. Reportedly fell forward out of her wheelchair. Hematoma to right forehead. Patient has baseline severe dementia. Reportedly is sometimes alert and oriented to self. She will not speak for me. She will look at me however. Will not follow commands.   Past Medical History:  Diagnosis Date  . Alzheimer disease   . Dementia   . GERD (gastroesophageal reflux disease)   . Hypertension   . Sleep disorder     Patient Active Problem List   Diagnosis Date Noted  . HYPERLIPIDEMIA 05/18/2009  . DEPRESSION 05/18/2009  . PULMONARY NODULE 05/18/2009  . GASTRITIS 05/18/2009  . DIVERTICULOSIS, COLON 05/18/2009  . ANGIODYSPLASIA OF INTESTINE 05/18/2009  . ARTHRITIS 05/18/2009  . ABDOMINAL PAIN, UNSPECIFIED SITE 05/18/2009  . COLONIC POLYPS, HX OF 05/18/2009  . Personal history of other diseases of digestive system 05/18/2009  . ANAL FISSURE, HX OF 05/18/2009    Past Surgical History:  Procedure Laterality Date  . NM MYOCAR PERF WALL MOTION  02/23/2009   No ischemia, EF 74%    OB History    No data available       Home Medications    Prior to Admission medications   Medication Sig Start Date End Date Taking? Authorizing Provider  Cholecalciferol (VITAMIN D-3) 1000 units CAPS Take 1 capsule by mouth daily.   Yes Historical Provider, MD  mirtazapine (REMERON) 7.5 MG tablet Take 7.5 mg by mouth at bedtime.   Yes Historical Provider, MD  Multiple Vitamins-Minerals (MULTIVITAMIN WITH MINERALS) tablet Take 1 tablet by mouth daily.   Yes Historical Provider, MD  ciprofloxacin (CIPRO) 500 MG tablet Take 1 tablet (500 mg total) by mouth 2 (two) times daily. 09/01/16   Benjiman Core, MD  HYDROCHLOROTHIAZIDE  PO Take 12.5 mg by mouth daily.     Historical Provider, MD  MELOXICAM PO Take by mouth.    Historical Provider, MD  Memantine HCl (NAMENDA PO) Take by mouth.    Historical Provider, MD  omeprazole (PRILOSEC) 20 MG capsule Take 20 mg by mouth daily.    Historical Provider, MD  Valsartan (DIOVAN PO) Take 160 mg by mouth daily.     Historical Provider, MD    Family History Family History  Problem Relation Age of Onset  . Cancer Mother   . Heart failure Father   . Cancer Brother   . Hypertension Brother   . Cancer Sister     Social History Social History  Substance Use Topics  . Smoking status: Never Smoker  . Smokeless tobacco: Not on file  . Alcohol use No     Allergies   Penicillins; Beta adrenergic blockers; Lipitor [atorvastatin]; Vytorin [ezetimibe-simvastatin]; and Zetia [ezetimibe]   Review of Systems Review of Systems  Unable to perform ROS: Dementia     Physical Exam Updated Vital Signs BP (!) 137/107   Pulse (!) 138   Temp 98.2 F (36.8 C) (Rectal)   Resp 25   Wt 120 lb (54.4 kg)   SpO2 97%   BMI 19.97 kg/m   Physical Exam  Constitutional: She appears well-developed.  HENT:  Large hematoma to right forehead/temporal area.  Eyes: EOM are normal.  Neck: Neck supple.  Cardiovascular:  Tachycardia  Pulmonary/Chest: Effort normal. She exhibits no tenderness.  Abdominal: Soft. There is no tenderness.  Musculoskeletal: She exhibits no tenderness.  No tenderness to neck back shoulders upper extremities or lower extremities.  Neurological:  Patient's eyes are open but she is nonverbal. She will look to my voice and track with her eyes.  Skin: Capillary refill takes less than 2 seconds.     ED Treatments / Results  Labs (all labs ordered are listed, but only abnormal results are displayed) Labs Reviewed  BASIC METABOLIC PANEL - Abnormal; Notable for the following:       Result Value   Glucose, Bld 124 (*)    All other components within normal  limits  CBC WITH DIFFERENTIAL/PLATELET - Abnormal; Notable for the following:    WBC 12.5 (*)    Neutro Abs 10.5 (*)    All other components within normal limits  URINALYSIS, ROUTINE W REFLEX MICROSCOPIC - Abnormal; Notable for the following:    APPearance CLOUDY (*)    Hgb urine dipstick TRACE (*)    Protein, ur 30 (*)    Nitrite POSITIVE (*)    Leukocytes, UA LARGE (*)    All other components within normal limits  URINALYSIS, MICROSCOPIC (REFLEX) - Abnormal; Notable for the following:    Bacteria, UA MANY (*)    Squamous Epithelial / LPF 0-5 (*)    All other components within normal limits    EKG  EKG Interpretation  Date/Time:  Monday September 01 2016 10:39:08 EST Ventricular Rate:  115 PR Interval:    QRS Duration: 93 QT Interval:  331 QTC Calculation: 458 R Axis:   51 Text Interpretation:  Sinus tachycardia Low voltage, extremity and precordial leads Baseline wander in lead(s) II III aVF Confirmed by Rubin Payor  MD, Cade Olberding [phone]) on 09/01/2016 10:43:10 AM       Radiology Dg Chest 2 View  Result Date: 09/01/2016 CLINICAL DATA:  Fall out of wheelchair this morning. EXAM: CHEST  2 VIEW COMPARISON:  04/29/2011 FINDINGS: Elevation of the right hemidiaphragm. Right basilar atelectasis. Left lung is clear. Heart is normal size. No effusions or acute bony abnormality. IMPRESSION: Elevation of the right hemidiaphragm with right base atelectasis. Electronically Signed   By: Charlett Nose M.D.   On: 09/01/2016 12:46   Ct Head Wo Contrast  Result Date: 09/01/2016 CLINICAL DATA:  Fall out of a wheelchair today with a hematoma about the right eye. EXAM: CT HEAD WITHOUT CONTRAST CT CERVICAL SPINE WITHOUT CONTRAST TECHNIQUE: Multidetector CT imaging of the head and cervical spine was performed following the standard protocol without intravenous contrast. Multiplanar CT image reconstructions of the cervical spine were also generated. COMPARISON:  Head and cervical spine CT scans 10/04/2014.  FINDINGS: CT HEAD FINDINGS Brain: Cortical atrophy and chronic microvascular ischemic change are identified. No evidence of acute abnormality including hemorrhage, infarct, mass lesion, mass effect, midline shift or abnormal extra-axial fluid collection. No hydrocephalus or pneumocephalus. Vascular: Negative. Skull: Intact. Sinuses/Orbits: Negative. Other: Hematoma about the right eyes identified. CT CERVICAL SPINE FINDINGS Alignment: Trace retrolisthesis C4 on C5 due to degenerative change is noted. Skull base and vertebrae: No acute fracture. No primary bone lesion or focal pathologic process. Soft tissues and spinal canal: No prevertebral fluid or swelling. No visible canal hematoma. Disc levels: Multilevel loss of disc space height appears worst at C4-5 and C5-6. Upper chest: The lung apices are clear.  Atherosclerosis noted. Other: None. IMPRESSION: Hematoma about the right eye. Negative for underlying  fracture or acute intracranial abnormality. No acute abnormality cervical spine. Atrophy and chronic microvascular ischemic change. Cervical spondylosis. Atherosclerosis. Electronically Signed   By: Drusilla Kannerhomas  Dalessio M.D.   On: 09/01/2016 10:41   Ct Cervical Spine Wo Contrast  Result Date: 09/01/2016 CLINICAL DATA:  Fall out of a wheelchair today with a hematoma about the right eye. EXAM: CT HEAD WITHOUT CONTRAST CT CERVICAL SPINE WITHOUT CONTRAST TECHNIQUE: Multidetector CT imaging of the head and cervical spine was performed following the standard protocol without intravenous contrast. Multiplanar CT image reconstructions of the cervical spine were also generated. COMPARISON:  Head and cervical spine CT scans 10/04/2014. FINDINGS: CT HEAD FINDINGS Brain: Cortical atrophy and chronic microvascular ischemic change are identified. No evidence of acute abnormality including hemorrhage, infarct, mass lesion, mass effect, midline shift or abnormal extra-axial fluid collection. No hydrocephalus or pneumocephalus.  Vascular: Negative. Skull: Intact. Sinuses/Orbits: Negative. Other: Hematoma about the right eyes identified. CT CERVICAL SPINE FINDINGS Alignment: Trace retrolisthesis C4 on C5 due to degenerative change is noted. Skull base and vertebrae: No acute fracture. No primary bone lesion or focal pathologic process. Soft tissues and spinal canal: No prevertebral fluid or swelling. No visible canal hematoma. Disc levels: Multilevel loss of disc space height appears worst at C4-5 and C5-6. Upper chest: The lung apices are clear.  Atherosclerosis noted. Other: None. IMPRESSION: Hematoma about the right eye. Negative for underlying fracture or acute intracranial abnormality. No acute abnormality cervical spine. Atrophy and chronic microvascular ischemic change. Cervical spondylosis. Atherosclerosis. Electronically Signed   By: Drusilla Kannerhomas  Dalessio M.D.   On: 09/01/2016 10:41    Procedures Procedures (including critical care time)  Medications Ordered in ED Medications  sodium chloride 0.9 % bolus 1,000 mL (0 mLs Intravenous Stopped 09/01/16 1339)  ciprofloxacin (CIPRO) IVPB 400 mg (0 mg Intravenous Stopped 09/01/16 1527)     Initial Impression / Assessment and Plan / ED Course  I have reviewed the triage vital signs and the nursing notes.  Pertinent labs & imaging results that were available during my care of the patient were reviewed by me and considered in my medical decision making (see chart for details).     A patient with fall. Facialhematoma with negative imaging. She was however tachycardic. Reportedly is trending towards palliative care. Discussed with patient's son here that did want some workup done. Has a likely urinary tract infection. Reportedly was only on limited medicines. IV fluid given in labs overall reassuring. Culture sent. Discussed with patient's son and states that they do not want admission to the hospital. Will discharge back to nursing home. May require IV medications there. Also  needs to have goals of care discussion.  Final Clinical Impressions(s) / ED Diagnoses   Final diagnoses:  Acute cystitis without hematuria  Tachycardia  Fall, initial encounter  Contusion of face, initial encounter    New Prescriptions New Prescriptions   CIPROFLOXACIN (CIPRO) 500 MG TABLET    Take 1 tablet (500 mg total) by mouth 2 (two) times daily.     Benjiman CoreNathan Oceanna Arruda, MD 09/01/16 [phone]
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
## 2016-09-01 NOTE — Discharge Instructions (Signed)
Patient has a urinary tract infection. Tachycardic. Discussed with patient's family and they appear to want somewhat limited interventions. Does not want hospitalization. She was given Cipro here but her nausea tolerated orals at home. Could potentially require IV medicines.

## 2016-09-01 NOTE — ED Notes (Signed)
[facility] here for [facility]

## 2016-09-01 NOTE — ED Notes (Signed)
Dr. Rubin PayorPickering made aware of pt's HR 120s

## 2016-09-01 NOTE — ED Notes (Signed)
[facility]: [phone]

## 2016-09-01 NOTE — ED Notes (Signed)
Patient transported to CT 

## 2016-09-01 NOTE — ED Notes (Signed)
Patient is resting comfortably. 

## 2016-09-01 NOTE — ED Notes (Signed)
Family at bedside feeding pt applesauce

## 2016-09-01 NOTE — ED Notes (Signed)
Pt cleaned and changed with 2 assist, voided and BM

## 2016-09-01 NOTE — ED Notes (Signed)
ED Provider at bedside discussing treatment with family.

## 2016-09-11 DEATH — deceased

## 2017-05-11 DEATH — deceased

## 2021-09-11 DEATH — deceased
